# Patient Record
Sex: Female | Born: 1960 | Race: Black or African American | Hispanic: No | Marital: Single | Smoking: Never smoker
Health system: Southern US, Community
[De-identification: ages and names within clinical notes are randomized; demographics above are authoritative.]

## PROBLEM LIST (undated history)

## (undated) DIAGNOSIS — T7840XA Allergy, unspecified, initial encounter: Secondary | ICD-10-CM

## (undated) HISTORY — PX: CYSTECTOMY: SUR359

## (undated) HISTORY — DX: Allergy, unspecified, initial encounter: T78.40XA

## (undated) HISTORY — PX: ABDOMINAL HYSTERECTOMY: SHX81

---

## 2004-12-27 ENCOUNTER — Ambulatory Visit: Payer: Self-pay | Admitting: Obstetrics and Gynecology

## 2006-01-02 ENCOUNTER — Ambulatory Visit: Payer: Self-pay | Admitting: Obstetrics and Gynecology

## 2006-01-20 ENCOUNTER — Ambulatory Visit: Payer: Self-pay | Admitting: Obstetrics and Gynecology

## 2007-01-06 ENCOUNTER — Ambulatory Visit: Payer: Self-pay | Admitting: Obstetrics and Gynecology

## 2008-01-19 ENCOUNTER — Ambulatory Visit: Payer: Self-pay | Admitting: Obstetrics and Gynecology

## 2009-01-19 ENCOUNTER — Ambulatory Visit: Payer: Self-pay | Admitting: Obstetrics and Gynecology

## 2010-01-23 ENCOUNTER — Ambulatory Visit: Payer: Self-pay | Admitting: Obstetrics and Gynecology

## 2011-01-28 ENCOUNTER — Ambulatory Visit: Payer: Self-pay | Admitting: Obstetrics and Gynecology

## 2012-01-29 ENCOUNTER — Ambulatory Visit: Payer: Self-pay | Admitting: Obstetrics and Gynecology

## 2012-12-29 LAB — HM PAP SMEAR

## 2012-12-29 LAB — HM MAMMOGRAPHY

## 2013-02-08 ENCOUNTER — Ambulatory Visit: Payer: Self-pay | Admitting: Obstetrics and Gynecology

## 2013-05-07 ENCOUNTER — Ambulatory Visit: Payer: Self-pay | Admitting: Unknown Physician Specialty

## 2013-05-07 LAB — HM COLONOSCOPY

## 2014-02-10 ENCOUNTER — Ambulatory Visit: Payer: Self-pay | Admitting: Obstetrics and Gynecology

## 2014-08-10 LAB — LIPID PANEL
CHOLESTEROL: 160 mg/dL (ref 0–200)
HDL: 44 mg/dL (ref 35–70)
LDL Cholesterol: 102 mg/dL
Triglycerides: 68 mg/dL (ref 40–160)

## 2014-08-10 LAB — BASIC METABOLIC PANEL
BUN: 9 mg/dL (ref 4–21)
Creatinine: 1 mg/dL (ref 0.5–1.1)
Glucose: 99 mg/dL
Potassium: 4.5 mmol/L (ref 3.4–5.3)
SODIUM: 140 mmol/L (ref 137–147)

## 2015-02-23 ENCOUNTER — Other Ambulatory Visit: Payer: Self-pay | Admitting: Obstetrics and Gynecology

## 2015-02-23 DIAGNOSIS — Z1231 Encounter for screening mammogram for malignant neoplasm of breast: Secondary | ICD-10-CM

## 2015-03-07 ENCOUNTER — Ambulatory Visit
Admission: RE | Admit: 2015-03-07 | Discharge: 2015-03-07 | Disposition: A | Discharge status: Home / Self Care | Payer: BLUE CROSS/BLUE SHIELD | Source: Ambulatory Visit | Attending: Obstetrics and Gynecology | Admitting: Obstetrics and Gynecology

## 2015-03-07 DIAGNOSIS — Z1231 Encounter for screening mammogram for malignant neoplasm of breast: Secondary | ICD-10-CM

## 2015-03-31 ENCOUNTER — Ambulatory Visit (INDEPENDENT_AMBULATORY_CARE_PROVIDER_SITE_OTHER): Payer: BLUE CROSS/BLUE SHIELD | Admitting: Family Medicine

## 2015-03-31 ENCOUNTER — Encounter: Payer: Self-pay | Admitting: Emergency Medicine

## 2015-03-31 ENCOUNTER — Encounter: Payer: Self-pay | Admitting: Family Medicine

## 2015-03-31 VITALS — BP 110/74 | HR 64 | Temp 97.9°F | Resp 16 | Wt 246.0 lb

## 2015-03-31 DIAGNOSIS — Z789 Other specified health status: Secondary | ICD-10-CM | POA: Insufficient documentation

## 2015-03-31 DIAGNOSIS — S39012A Strain of muscle, fascia and tendon of lower back, initial encounter: Secondary | ICD-10-CM | POA: Diagnosis not present

## 2015-03-31 DIAGNOSIS — L819 Disorder of pigmentation, unspecified: Secondary | ICD-10-CM | POA: Insufficient documentation

## 2015-03-31 MED ORDER — CYCLOBENZAPRINE HCL 5 MG PO TABS: 5.0000 mg | ORAL_TABLET | Freq: Three times a day (TID) | ORAL | Status: DC | PRN

## 2015-03-31 NOTE — Patient Instructions (Signed)
Continue ibuprofen 400 mg. 3 x day with food. You may add Tylenol up to 3000 mg/day. Cold compresses for 20 minutes several x today-thereafter use heat.

## 2015-03-31 NOTE — Progress Notes (Signed)
Subjective:     Patient ID: Jennifer Sawyer, female   DOB: 11/10/1960, 54 y.o.   MRN: [id number]  HPI  Chief Complaint  Patient presents with  . Hip Pain  States she was working in her yard prior to the onset of her sx two days ago. Localizes to her right low back. No radiation of pain. Has been taking up to 400 mg. ibuprofen twice daily. No hx of back surgery   Review of Systems  Genitourinary: Negative for dysuria.       Objective:   Physical Exam  Constitutional: She appears well-developed and well-nourished. She appears distressed (moderate pain when changing positions).  Muscle strength in lower extremities 5/5. Left SLR to 90 degrees without radiation of back pain. Right SLR to 45 degrees before increased back pain without radiation. Tender over her right SI area.    Assessment:    1. Low back strain, initial encounter - cyclobenzaprine (FLEXERIL) 5 MG tablet; Take 1 tablet (5 mg total) by mouth 3 (three) times daily as needed for muscle spasms.  Dispense: 21 tablet; Refill: 0    Plan:    Continue nsaid's. Discussed application of cold and warm compresses. Return if not improving or radicular sx.

## 2015-08-16 ENCOUNTER — Encounter: Payer: Self-pay | Admitting: Family Medicine

## 2015-08-16 ENCOUNTER — Ambulatory Visit (INDEPENDENT_AMBULATORY_CARE_PROVIDER_SITE_OTHER): Payer: BLUE CROSS/BLUE SHIELD | Admitting: Family Medicine

## 2015-08-16 VITALS — BP 120/82 | HR 85 | Temp 98.7°F | Resp 16 | Ht 65.0 in | Wt 250.0 lb

## 2015-08-16 DIAGNOSIS — Z0000 Encounter for general adult medical examination without abnormal findings: Secondary | ICD-10-CM

## 2015-08-16 DIAGNOSIS — E669 Obesity, unspecified: Secondary | ICD-10-CM | POA: Insufficient documentation

## 2015-08-16 NOTE — Patient Instructions (Addendum)
We will call you with the lab results. Start exercising 30 minutes daily. Consider Weight Watcher's program. Continue with low fat food choices.

## 2015-08-16 NOTE — Progress Notes (Signed)
Subjective:     Patient ID: Jennifer Sawyer, female   DOB: 01/23/1961, 54 y.o.   MRN: [id number]  HPI  Chief Complaint  Patient presents with  . Annual Exam    Patient comes in office today fo her annual physical, patient states that she has no questions or concerns to address today. Patient states that she recieved flu vaccine from work in 07/2015  States she has not been motivated to exercise. Reports low fat food choices most of the time.   Review of Systems General: Feeling well HEENT: regular dental visits and eye exams Cardiovascular: no chest pain, shortness of breath, or palpitations GI: no heartburn, no change in bowel habits. States increase in dietary fiber has resolved straining. GU:  no change in bladder habits. Up to date with Gyn in the Spring Psychiatric: not depressed Musculoskeletal: occasional left knee pain when going up stairs.    Objective:   Physical Exam  Constitutional: She appears well-developed and well-nourished. No distress.  Eyes: Pupils equal (exam suboptimal due to presence of colored contacts) EOMI Neck: no thyromegaly, tenderness or nodules; no cervical lymphadenopathy, no carotid bruits. ENT: TM's intact without inflammation; No tonsillar enlargement or exudate, Lungs: Clear Heart : RRR without murmur or gallop Abd: bowel sounds present, soft, non-tender, no organomegaly Extremities: no edema .     Assessment:    1. Annual physical exam - Comprehensive metabolic panel - Lipid panel  2. Obesity    Plan:    Further f/u pending lab work. Encouraged regular exercise/Weight Watcher's.

## 2015-08-17 ENCOUNTER — Telehealth: Payer: Self-pay

## 2015-08-17 LAB — COMPREHENSIVE METABOLIC PANEL
A/G RATIO: 1 — AB (ref 1.1–2.5)
ALT: 18 IU/L (ref 0–32)
AST: 17 IU/L (ref 0–40)
Albumin: 4.1 g/dL (ref 3.5–5.5)
Alkaline Phosphatase: 62 IU/L (ref 39–117)
BUN/Creatinine Ratio: 13 (ref 9–23)
BUN: 12 mg/dL (ref 6–24)
Bilirubin Total: 0.6 mg/dL (ref 0.0–1.2)
CO2: 25 mmol/L (ref 18–29)
Calcium: 9.6 mg/dL (ref 8.7–10.2)
Chloride: 102 mmol/L (ref 97–106)
Creatinine, Ser: 0.94 mg/dL (ref 0.57–1.00)
GFR, EST AFRICAN AMERICAN: 80 mL/min/{1.73_m2} (ref >59)
GFR, EST NON AFRICAN AMERICAN: 69 mL/min/{1.73_m2} (ref >59)
Globulin, Total: 4.3 g/dL (ref 1.5–4.5)
Glucose: 105 mg/dL — ABNORMAL HIGH (ref 65–99)
POTASSIUM: 4.6 mmol/L (ref 3.5–5.2)
Sodium: 141 mmol/L (ref 136–144)
TOTAL PROTEIN: 8.4 g/dL (ref 6.0–8.5)

## 2015-08-17 LAB — LIPID PANEL
Chol/HDL Ratio: 3.9 ratio units (ref 0.0–4.4)
Cholesterol, Total: 179 mg/dL (ref 100–199)
HDL: 46 mg/dL (ref >39)
LDL Calculated: 116 mg/dL — ABNORMAL HIGH (ref 0–99)
Triglycerides: 87 mg/dL (ref 0–149)
VLDL CHOLESTEROL CAL: 17 mg/dL (ref 5–40)

## 2015-08-17 LAB — COMMENT

## 2015-08-17 NOTE — Telephone Encounter (Signed)
[id number] 

## 2015-08-17 NOTE — Telephone Encounter (Signed)
-----   Message from Anola Gurneyobert Chauvin, [place] sent at 08/17/2015  7:56 AM EST ----- Cholesterol is mildly elevated but your sugar has crept up to the pre-diabetes range. Jennifer HahnKat will provide you with a  Phone number about a one time class at [facility] which will give you information about dietary choices and exercises. Hopefully this will motivate you and prevent further development into diabetes.

## 2015-08-18 NOTE — Telephone Encounter (Signed)
Patient has been advised of lab report, I notified her I will be mailing her handout in regards to pre-diabetes classes offered thru [facility]. KW

## 2015-10-03 ENCOUNTER — Telehealth: Payer: Self-pay | Admitting: Family Medicine

## 2015-10-03 NOTE — Telephone Encounter (Signed)
[id number] 

## 2015-10-03 NOTE — Telephone Encounter (Signed)
Pt would like Kat to return her call about documention for a referral, pt states she has talked with Georgiann HahnKat about this last week. Thanks CC

## 2015-10-04 ENCOUNTER — Other Ambulatory Visit: Payer: Self-pay | Admitting: Family Medicine

## 2015-10-04 DIAGNOSIS — E669 Obesity, unspecified: Secondary | ICD-10-CM

## 2015-10-04 NOTE — Telephone Encounter (Signed)
Referral in progress. 

## 2015-10-04 NOTE — Telephone Encounter (Signed)
Pt called back. She said please call her at [phone].  Thanks Barth Kirkseri

## 2015-10-04 NOTE — Telephone Encounter (Signed)
Spoke with patient on the phone she states that back in November you had recommenced for there to speak to a dietitian at [facility]. She states that she found out her insurance will cover the visits and would like for you to generate referral for her to be seen. She states that she spoke with a Cameron Proudina Brown from [facility] and she said there is a form physician normally  Lenox AhrFill out for evaluation, she states that Mrs. Browns fax number is [phone].KW

## 2015-10-24 ENCOUNTER — Encounter: Payer: BLUE CROSS/BLUE SHIELD | Attending: Family Medicine | Admitting: Dietician

## 2015-10-24 VITALS — Ht 65.0 in | Wt 255.9 lb

## 2015-10-24 DIAGNOSIS — E669 Obesity, unspecified: Secondary | ICD-10-CM

## 2015-10-24 NOTE — Patient Instructions (Signed)
Increase walking to 2 miles at lunch break. Balance meals with protein, 2-3 servings of carbohydrate and non-starchy vegetables. To participate in an exercise class in addition to walking at lunch. Measure out some of the starchy foods to check portions. Balance a higher fat food with low fat vegetables and fruits.

## 2015-10-24 NOTE — Progress Notes (Signed)
Medical Nutrition Therapy: Visit start time: 8:50 end time: 9:45 Assessment:  Diagnosis: obesity Psychosocial issues/ stress concerns: Patient rates her stress as "moderate" and indicates "ok" as to how she is dealing with her stress. Preferred learning method:  . Hands-on Current weight: 255.9 lbs (clothes/shoes)  Height: 65 in Medications, supplements: fish oil, multivitamin, garlic tablet, cinnamon tablet; no medications  Progress and evaluation:  Patient in for initial nutrition assessment. She reports she has had success in the past with weight loss efforts, losing 60 lbs approximately 4 years ago through [employer]. She participates each year in "Be Healthy Now [place]" and is looking forward to doing so this year. She states she knows what she needs to do but is having trouble feeling motivated. Rates her motivation to make diet and exercise changes as a "5" on a 0-10 scale. She identifies an inconsistent meal pattern as a problem area and states "I love bread" which she states is her "weakness" related diet. She likes fruits, a variety of vegetables, baked meats,etc but states she needs a push for better controlling portions. Her main beverage is water and she drinks 6-7 cups per day. Rarely drinks beverages with sugar.  Physical activity: walks at work during lunch break for 30 minutes  Dietary Intake:  Usual eating pattern includes 3 meals and 2 snacks per day. Dining out frequency: 3 meals per week.  Breakfast: 5:45am- cereal/banana or cheese toast or egg/2 strips bacon, milk or orange juice Snack: grapes, celery; cheese stick Lunch: 1:30pm- salad, chicken nuggets or [employer] 6" veggie sandwich, chips, water Snack: apple Supper: 7:00- 7:30pm baked chicken, white rice or steamed vegetables, water Snack: usually no snack Beverages: water, Crystal Lite occasional tea when dining "out",  Nutrition Care Education: Weight control: Instructed patient on a meal plan based on 1600  calories including identifying carbohydrate foods, and balancing with protein and non-starchy vegetables. Stressed strategies verses strict dieting explaining that meal plan is a guide helping her to be more mindful of what and how much she is eating. Discussed portion control. Also, encouraged to focus on foods needed to meet nutrient needs verses only on foods to be limited.  Nutritional Diagnosis:  [place]-3.3 Overweight/obesity As related to inconsistent meal pattern, lack of portion control.  As evidenced by diet history..  Intervention:  Increase walking to 2 miles at lunch break. Balance meals with protein, 2-3 servings of carbohydrate and non-starchy vegetables. To participate in an exercise class in addition to walking at lunch. Measure out some of the starchy foods to check portions. Balance a higher fat food with low fat vegetables and fruits.   Education Materials given:  .  Marland Kitchen Food lists/ Planning A Balanced Meal . Sample pattern/ menus . Goals/ instructions Learner/ who was taught:  . Patient  Level of understanding: . Partial understanding; needs review/ practice Learning barriers: . None  Willingness to learn/ readiness for change: . Eager, change in progress Monitoring and Evaluation:  Dietary intake, exercise,  and body weight      follow up: Feb. 21, 2017 at 9:00am

## 2015-11-21 ENCOUNTER — Ambulatory Visit: Payer: BLUE CROSS/BLUE SHIELD | Admitting: Dietician

## 2015-12-19 ENCOUNTER — Encounter: Payer: Self-pay | Admitting: Dietician

## 2016-02-15 ENCOUNTER — Other Ambulatory Visit: Payer: Self-pay | Admitting: Obstetrics and Gynecology

## 2016-02-15 DIAGNOSIS — Z1231 Encounter for screening mammogram for malignant neoplasm of breast: Secondary | ICD-10-CM

## 2016-03-07 ENCOUNTER — Other Ambulatory Visit: Payer: Self-pay | Admitting: Obstetrics and Gynecology

## 2016-03-07 ENCOUNTER — Ambulatory Visit
Admission: RE | Admit: 2016-03-07 | Discharge: 2016-03-07 | Disposition: A | Discharge status: Home / Self Care | Payer: BLUE CROSS/BLUE SHIELD | Source: Ambulatory Visit | Attending: Obstetrics and Gynecology | Admitting: Obstetrics and Gynecology

## 2016-03-07 DIAGNOSIS — Z1231 Encounter for screening mammogram for malignant neoplasm of breast: Secondary | ICD-10-CM

## 2016-08-20 ENCOUNTER — Encounter: Payer: Self-pay | Admitting: Family Medicine

## 2016-08-20 ENCOUNTER — Ambulatory Visit (INDEPENDENT_AMBULATORY_CARE_PROVIDER_SITE_OTHER): Payer: BLUE CROSS/BLUE SHIELD | Admitting: Family Medicine

## 2016-08-20 VITALS — BP 120/68 | HR 84 | Temp 98.3°F | Resp 16 | Ht 66.0 in | Wt 260.0 lb

## 2016-08-20 DIAGNOSIS — Z0000 Encounter for general adult medical examination without abnormal findings: Secondary | ICD-10-CM | POA: Diagnosis not present

## 2016-08-20 DIAGNOSIS — E6609 Other obesity due to excess calories: Secondary | ICD-10-CM | POA: Diagnosis not present

## 2016-08-20 DIAGNOSIS — IMO0001 Reserved for inherently not codable concepts without codable children: Secondary | ICD-10-CM

## 2016-08-20 DIAGNOSIS — Z6841 Body Mass Index (BMI) 40.0 and over, adult: Secondary | ICD-10-CM | POA: Diagnosis not present

## 2016-08-20 DIAGNOSIS — W57XXXA Bitten or stung by nonvenomous insect and other nonvenomous arthropods, initial encounter: Secondary | ICD-10-CM

## 2016-08-20 MED ORDER — FLUOCINONIDE 0.05 % EX CREA: TOPICAL_CREAM | CUTANEOUS | 0 refills | Status: DC

## 2016-08-20 NOTE — Patient Instructions (Signed)
We will call you with the lab results. Consider walking at least 30 minutes daily.

## 2016-08-20 NOTE — Progress Notes (Signed)
Subjective:     Patient ID: Jennifer Sawyer, female   DOB: 10/17/1960, 55 y.o.   MRN: [id number]  HPI  Chief Complaint  Patient presents with  . Annual Exam    Last CPE- 08/16/2015. Saw GYN for yearly gynecological exam on 02/29/2016. Last colonoscopy- 05/07/2013 with Dr. Mechele CollinElliott; sigmoid diverticulosis and internal hemorrhoids. Pt feels well, other than a bug bite/ rash on right side of neck x 2 weeks. Pt has tried Benadryl and hydrocortisone cream for this, with relief.  States rash has improved but still has mild itching. Phone image at onset two weeks ago appears vesicular. Continues to live with her 55 year old niece.   Review of Systems General: Feeling well. Admits to not paying attention to her weight issue due to focusing on work. States appetite and food choices and not an issue. HEENT: regular dental visits and eye exams Cardiovascular: no chest pain, shortness of breath, or palpitations GI: no heartburn, no change in bowel habits or blood in the stool GU:  no change in bladder habits  Psychiatric: not depressed; but admits to stress due to a new boss and having to change their way of doing things at work. Musculoskeletal: no joint pain    Objective:   Physical Exam  Constitutional: She appears well-developed and well-nourished. No distress.  Eyes:  EOMI, pupils not well seen due to colored contacts Neck: no thyromegaly, tenderness or nodules, no carotid bruits or cervical adenopathy ENT: TM's intact without inflammation; No tonsillar enlargement or exudate, Lungs: Clear Heart : RRR without murmur or gallop Abd: bowel sounds present, soft, non-tender, no organomegaly Extremities: no edema Skin: right neck with several residual 3-4 mm papules     Assessment:    1. Annual physical exam - Comprehensive metabolic panel - Lipid panel  2. Class 3 obesity due to excess calories without serious comorbidity with body mass index (BMI) of 40.0 to 44.9 in adult (HCC) - T4,  free - TSH  3. Insect bite, initial encounter - fluocinonide cream (LIDEX) 0.05 %; To insect bites  Dispense: 30 g; Refill: 0    Plan:    Discussed increasing walking program to daily. Further f/u pending lab work.

## 2016-08-21 ENCOUNTER — Telehealth: Payer: Self-pay

## 2016-08-21 LAB — COMPREHENSIVE METABOLIC PANEL
A/G RATIO: 0.9 — AB (ref 1.2–2.2)
ALK PHOS: 61 IU/L (ref 39–117)
ALT: 12 IU/L (ref 0–32)
AST: 14 IU/L (ref 0–40)
Albumin: 4 g/dL (ref 3.5–5.5)
BILIRUBIN TOTAL: 0.5 mg/dL (ref 0.0–1.2)
BUN/Creatinine Ratio: 15 (ref 9–23)
BUN: 13 mg/dL (ref 6–24)
CHLORIDE: 101 mmol/L (ref 96–106)
CO2: 26 mmol/L (ref 18–29)
Calcium: 9.3 mg/dL (ref 8.7–10.2)
Creatinine, Ser: 0.84 mg/dL (ref 0.57–1.00)
GFR calc non Af Amer: 79 mL/min/{1.73_m2} (ref >59)
GFR, EST AFRICAN AMERICAN: 91 mL/min/{1.73_m2} (ref >59)
Globulin, Total: 4.3 g/dL (ref 1.5–4.5)
Glucose: 98 mg/dL (ref 65–99)
POTASSIUM: 4.6 mmol/L (ref 3.5–5.2)
Sodium: 140 mmol/L (ref 134–144)
TOTAL PROTEIN: 8.3 g/dL (ref 6.0–8.5)

## 2016-08-21 LAB — LIPID PANEL
Chol/HDL Ratio: 4.4 ratio units (ref 0.0–4.4)
Cholesterol, Total: 175 mg/dL (ref 100–199)
HDL: 40 mg/dL (ref >39)
LDL Calculated: 120 mg/dL — ABNORMAL HIGH (ref 0–99)
Triglycerides: 75 mg/dL (ref 0–149)
VLDL Cholesterol Cal: 15 mg/dL (ref 5–40)

## 2016-08-21 LAB — TSH: TSH: 1.67 u[IU]/mL (ref 0.450–4.500)

## 2016-08-21 LAB — T4, FREE: FREE T4: 1.09 ng/dL (ref 0.82–1.77)

## 2016-08-21 NOTE — Telephone Encounter (Signed)
-----   Message from Anola Gurneyobert Chauvin, [place] sent at 08/21/2016  7:26 AM EST ----- Labs look good. Sugar and thyroid are ok. Cholesterol is mildly elevated but your calculated 10 year risk for developing cardiovascular disease is low at 2.9%. We usually recommend a cholesterol drug lowering at 7.5%.

## 2016-08-21 NOTE — Telephone Encounter (Signed)
Patient was advised. KW 

## 2017-03-07 ENCOUNTER — Other Ambulatory Visit: Payer: Self-pay | Admitting: Obstetrics and Gynecology

## 2017-03-07 DIAGNOSIS — Z1231 Encounter for screening mammogram for malignant neoplasm of breast: Secondary | ICD-10-CM

## 2017-03-18 ENCOUNTER — Ambulatory Visit
Admission: RE | Admit: 2017-03-18 | Discharge: 2017-03-18 | Disposition: A | Discharge status: Home / Self Care | Payer: BLUE CROSS/BLUE SHIELD | Source: Ambulatory Visit | Attending: Obstetrics and Gynecology | Admitting: Obstetrics and Gynecology

## 2017-03-18 DIAGNOSIS — Z1231 Encounter for screening mammogram for malignant neoplasm of breast: Secondary | ICD-10-CM

## 2017-08-22 ENCOUNTER — Encounter: Payer: Self-pay | Admitting: Family Medicine

## 2017-08-22 ENCOUNTER — Ambulatory Visit (INDEPENDENT_AMBULATORY_CARE_PROVIDER_SITE_OTHER): Payer: BLUE CROSS/BLUE SHIELD | Admitting: Family Medicine

## 2017-08-22 VITALS — BP 126/70 | HR 82 | Temp 98.0°F | Resp 16 | Ht 66.0 in | Wt 250.0 lb

## 2017-08-22 DIAGNOSIS — Z0000 Encounter for general adult medical examination without abnormal findings: Secondary | ICD-10-CM

## 2017-08-22 DIAGNOSIS — Z131 Encounter for screening for diabetes mellitus: Secondary | ICD-10-CM | POA: Diagnosis not present

## 2017-08-22 DIAGNOSIS — J018 Other acute sinusitis: Secondary | ICD-10-CM

## 2017-08-22 DIAGNOSIS — Z6841 Body Mass Index (BMI) 40.0 and over, adult: Secondary | ICD-10-CM | POA: Diagnosis not present

## 2017-08-22 DIAGNOSIS — Z13220 Encounter for screening for lipoid disorders: Secondary | ICD-10-CM

## 2017-08-22 LAB — LIPID PANEL
CHOL/HDL RATIO: 3.6 (calc) (ref <5.0)
CHOLESTEROL: 174 mg/dL (ref <200)
HDL: 48 mg/dL — ABNORMAL LOW (ref >50)
LDL CHOLESTEROL (CALC): 110 mg/dL — AB
Non-HDL Cholesterol (Calc): 126 mg/dL (calc) (ref <130)
TRIGLYCERIDES: 75 mg/dL (ref <150)

## 2017-08-22 LAB — COMPLETE METABOLIC PANEL WITH GFR
AG RATIO: 1 (calc) (ref 1.0–2.5)
ALBUMIN MSPROF: 3.9 g/dL (ref 3.6–5.1)
ALT: 15 U/L (ref 6–29)
AST: 15 U/L (ref 10–35)
Alkaline phosphatase (APISO): 58 U/L (ref 33–130)
BILIRUBIN TOTAL: 0.5 mg/dL (ref 0.2–1.2)
BUN: 14 mg/dL (ref 7–25)
CHLORIDE: 105 mmol/L (ref 98–110)
CO2: 28 mmol/L (ref 20–32)
CREATININE: 0.9 mg/dL (ref 0.50–1.05)
Calcium: 9.1 mg/dL (ref 8.6–10.4)
GFR, EST AFRICAN AMERICAN: 83 mL/min/{1.73_m2} (ref >=60)
GFR, Est Non African American: 72 mL/min/{1.73_m2} (ref >=60)
GLUCOSE: 97 mg/dL (ref 65–99)
Globulin: 4.1 g/dL (calc) — ABNORMAL HIGH (ref 1.9–3.7)
Potassium: 4.2 mmol/L (ref 3.5–5.3)
Sodium: 138 mmol/L (ref 135–146)
Total Protein: 8 g/dL (ref 6.1–8.1)

## 2017-08-22 MED ORDER — AMOXICILLIN-POT CLAVULANATE 875-125 MG PO TABS: 1.0000 | ORAL_TABLET | Freq: Two times a day (BID) | ORAL | 0 refills | Status: DC

## 2017-08-22 NOTE — Patient Instructions (Addendum)
We will call you with the lab results. Keep up your weight loss program! Use products like Mucinex D for your sinus congestioin

## 2017-08-22 NOTE — Progress Notes (Signed)
Subjective:     Patient ID: Jennifer Sawyer, female   DOB: 12/09/1960, 56 y.o.   MRN: [id number] Chief Complaint  Patient presents with  . Annual Exam   HPI Continues to work at [employer] and lives with her 56 year old niece. Works out 5-6 days/ weeks with walking and calisthenic type exercise. 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  Review of Systems General: Feeling well. Received flu shot at work in October. HEENT: regular dental visits and eye exams (contacts). Reports allergy flare a couple of weeks ago, now with purulent sinus drainage. Cardiovascular: no chest pain, shortness of breath, or palpitations GI: no heartburn, no change in bowel habits or blood in the stool GU:  no change in bladder habits. Has had gyn exam and mammogram this year Psychiatric: not depressed Musculoskeletal: no joint pain    Objective:   Physical Exam  Constitutional: She appears well-developed and well-nourished. No distress.  Eyes: Pupils equal (contacts diminish visualization of reactivity); EOMI Neck: no thyromegaly, tenderness or nodules,  ENT: TM's intact without inflammation; No tonsillar enlargement or exudate, Lungs: Clear Heart : RRR without murmur or gallop Abd: bowel sounds present, soft, non-tender, no organomegaly Extremities: no edema     Assessment:    1. Annual physical exam  2. Screening for cholesterol level - Lipid panel  3. Screening for diabetes mellitus - COMPLETE METABOLIC PANEL WITH GFR  4. Class 3 severe obesity due to excess calories without serious comorbidity with body mass index (BMI) of 40.0 to 44.9 in adult (HCC)  5. Other subacute sinusitis: Augmentin 875 mg. q 12 hours #20    Plan:    Further f/u pending lab work. May add Mucinex  D for her sinuses.

## 2017-08-25 ENCOUNTER — Telehealth: Payer: Self-pay

## 2017-08-25 ENCOUNTER — Other Ambulatory Visit: Payer: Self-pay | Admitting: Family Medicine

## 2017-08-25 MED ORDER — PSEUDOEPHEDRINE-GUAIFENESIN ER 60-600 MG PO TB12: 1.0000 | ORAL_TABLET | Freq: Two times a day (BID) | ORAL | 0 refills | Status: DC

## 2017-08-25 NOTE — Telephone Encounter (Signed)
Advised  ED 

## 2017-08-25 NOTE — Telephone Encounter (Signed)
Mucinex D sent in to [facility].

## 2017-08-25 NOTE — Telephone Encounter (Signed)
Pt advised of lab results.  She would also like a prescription for [facility] [place].  She says she can use her flexible spending if she has a prescription.   Thanks,   -Vernona RiegerLaura

## 2017-09-04 ENCOUNTER — Telehealth: Payer: Self-pay | Admitting: Family Medicine

## 2017-09-04 ENCOUNTER — Other Ambulatory Visit: Payer: Self-pay | Admitting: Family Medicine

## 2017-09-04 MED ORDER — FLUCONAZOLE 150 MG PO TABS: 150.0000 mg | ORAL_TABLET | Freq: Once | ORAL | 0 refills | Status: AC

## 2017-09-04 NOTE — Telephone Encounter (Signed)
done

## 2017-09-04 NOTE — Telephone Encounter (Signed)
Pt stated that taking amoxicillin-clavulanate (AUGMENTIN) 875-125 MG tablet has caused her to get a yeast infection & pt is requesting an Rx [facility]. Please advise. Thanks TNP

## 2017-09-04 NOTE — Telephone Encounter (Signed)
Please review. KW 

## 2017-11-27 ENCOUNTER — Ambulatory Visit: Payer: BLUE CROSS/BLUE SHIELD | Admitting: Family Medicine

## 2017-11-27 ENCOUNTER — Encounter: Payer: Self-pay | Admitting: Family Medicine

## 2017-11-27 VITALS — BP 122/74 | HR 74 | Temp 98.3°F | Resp 16 | Wt 246.0 lb

## 2017-11-27 DIAGNOSIS — J018 Other acute sinusitis: Secondary | ICD-10-CM

## 2017-11-27 MED ORDER — AMOXICILLIN-POT CLAVULANATE 875-125 MG PO TABS: 1.0000 | ORAL_TABLET | Freq: Two times a day (BID) | ORAL | 0 refills | Status: DC

## 2017-11-27 NOTE — Patient Instructions (Signed)
Resume Mucinex D for congestion and Delsym for cough. Let me know if dizziness does not improve.

## 2017-11-27 NOTE — Progress Notes (Signed)
Subjective:     Patient ID: Jennifer Sawyer, female   DOB: 10/03/1960, 57 y.o.   MRN: [id number] Chief Complaint  Patient presents with  . Cough    Patient has had cough and congestion X 1 week. She reports that she has been taking Alker seltzer plus without relief. She denies any fever. She does have history of allergies. She also mentions that she has had dizzy spells off and on. She also describes her cough as a dry, hacking cough.    HPI States she started two weeks ago with runny nose, watery eyes, and sneezing. Now has persistent purulent sinus drainage with PND and accompanying cough.  Review of Systems     Objective:   Physical Exam  Constitutional: She appears well-developed and well-nourished. No distress.  Ears: T.M's intact without inflammation Sinuses: non-tender Throat: no tonsillar enlargement or exudate Neck: no cervical adenopathy Lungs: clear     Assessment:    1. Other subacute sinusitis - amoxicillin-clavulanate (AUGMENTIN) 875-125 MG tablet; Take 1 tablet by mouth 2 (two) times daily.  Dispense: 20 tablet; Refill: 0    Plan:    Resume Mucinex D.Will call if dizziness does not improve.

## 2018-01-28 ENCOUNTER — Ambulatory Visit: Payer: BLUE CROSS/BLUE SHIELD | Admitting: Family Medicine

## 2018-01-28 ENCOUNTER — Encounter: Payer: Self-pay | Admitting: Family Medicine

## 2018-01-28 VITALS — BP 110/70 | HR 84 | Temp 98.6°F | Resp 16 | Wt 245.6 lb

## 2018-01-28 DIAGNOSIS — S39012A Strain of muscle, fascia and tendon of lower back, initial encounter: Secondary | ICD-10-CM

## 2018-01-28 MED ORDER — CYCLOBENZAPRINE HCL 5 MG PO TABS: 5.0000 mg | ORAL_TABLET | Freq: Three times a day (TID) | ORAL | 0 refills | Status: DC | PRN

## 2018-01-28 NOTE — Progress Notes (Signed)
  Subjective:     Patient ID: Jennifer Sawyer, female   DOB: 1961/01/04, 57 y.o.   MRN: [id number] Chief Complaint  Patient presents with  . Muscle Pain    Patient comes in office today with complaints of pain in her right hip and knee for the past 2 days or more. Patient reports difficulty with bending, squatting and standing up from sitting position. Patient denies any strenuous exercise or activity that would trigger pain. Paitent has been using biofreeze for pain relief.    HPI States she has had similar pain in the past which got better with Aleve. Denies radicular sx. States her right knee started hurting after her back.  Review of Systems     Objective:   Physical Exam  Constitutional: She appears well-developed and well-nourished. She appears distressed (moderate pain when changing positions).  Musculoskeletal:  Muscle strength in lower extremities 5/5. SLR's to 90 degrees without radiation of back pain. Localizes and is mildly tender in her right SI area. Right knee ligaments stable. No specific areas of tenderness appreciated.       Assessment:    1. Strain of lumbar region, initial encounter - cyclobenzaprine (FLEXERIL) 5 MG tablet; Take 1 tablet (5 mg total) by mouth 3 (three) times daily as needed for muscle spasms.  Dispense: 21 tablet; Refill: 0    Plan:    Discussed starting Aleve and warm compresses. Further f/u if not improving over the next two weeks. Discussed red flag sx with her.

## 2018-01-28 NOTE — Patient Instructions (Signed)
Start two Aleve twice daily with food. Apply warm compresses for 20 minutes several x day.

## 2018-03-11 ENCOUNTER — Other Ambulatory Visit: Payer: Self-pay | Admitting: Obstetrics and Gynecology

## 2018-03-11 DIAGNOSIS — Z1231 Encounter for screening mammogram for malignant neoplasm of breast: Secondary | ICD-10-CM

## 2018-03-11 LAB — HM PAP SMEAR: HM Pap smear: NEGATIVE

## 2018-04-03 ENCOUNTER — Ambulatory Visit
Admission: RE | Admit: 2018-04-03 | Discharge: 2018-04-03 | Disposition: A | Discharge status: Home / Self Care | Payer: BLUE CROSS/BLUE SHIELD | Source: Ambulatory Visit | Attending: Obstetrics and Gynecology | Admitting: Obstetrics and Gynecology

## 2018-04-03 DIAGNOSIS — Z1231 Encounter for screening mammogram for malignant neoplasm of breast: Secondary | ICD-10-CM | POA: Diagnosis present

## 2018-08-25 ENCOUNTER — Ambulatory Visit (INDEPENDENT_AMBULATORY_CARE_PROVIDER_SITE_OTHER): Payer: BLUE CROSS/BLUE SHIELD | Admitting: Family Medicine

## 2018-08-25 ENCOUNTER — Encounter: Payer: Self-pay | Admitting: Family Medicine

## 2018-08-25 VITALS — BP 124/66 | HR 90 | Temp 98.4°F | Resp 16 | Ht 65.5 in | Wt 254.4 lb

## 2018-08-25 DIAGNOSIS — Z6841 Body Mass Index (BMI) 40.0 and over, adult: Secondary | ICD-10-CM | POA: Diagnosis not present

## 2018-08-25 DIAGNOSIS — Z0000 Encounter for general adult medical examination without abnormal findings: Secondary | ICD-10-CM | POA: Diagnosis not present

## 2018-08-25 NOTE — Patient Instructions (Signed)
We will call you with the lab results. Continue regular exercise daily. For cold symptoms try Mucinex D for congestion and Delsym for cough.

## 2018-08-25 NOTE — Progress Notes (Signed)
  Subjective:     Patient ID: Jennifer Sawyer, female   DOB: 07/01/1961, 57 y.o.   MRN: [id number] Chief Complaint  Patient presents with  . Annual Exam    Patient comes in office today for her annual physical she states that she feels fairly well today and has had cold like symptoms for the past few days but treating with otc medication.  Patient reports following a balanced diet and staying active walking up to 3x a week, on average patient sleeps 7hrs a night. Patient last reported pap smear was 12/2017, mammogram 04/03/18, tdap 08/01/2011 and flu vaccine 06/2018.    HPI Continues to work at [employer]. 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 Sees Dr. Feliberto GottronSchermerhorn for gyn.  Review of Systems General: Reports mild cold sx. HEENT: regular dental visits and eye exams (contact and glasses) Cardiovascular: no chest pain, shortness of breath, or palpitations GI: no heartburn, no change in bowel habits GU: nocturia x 0, no change in bladder habits  Psychiatric: not depressed Musculoskeletal: no joint pain    Objective:   Physical Exam  Constitutional: She appears well-developed and well-nourished. No distress.  Eyes: Pupils not well seen due to colored contacts. EOMI Neck: no thyromegaly, tenderness or nodules, no cervical adenopathy or carotid bruits. ENT: TM's intact without inflammation; No tonsillar enlargement or exudate, Lungs: Clear Heart : RRR without murmur or gallop Abd: bowel sounds present, soft, non-tender, no organomegaly Extremities: no edema      Assessment:    1. Annual physical exam - Lipid panel - Comprehensive metabolic panel  2. Class 3 severe obesity due to excess calories without serious comorbidity with body mass index (BMI) of 40.0 to 44.9 in adult [facility](HCC)    Plan:    Further f/u pending lab results. Continue with daily regular exercise.

## 2018-08-26 ENCOUNTER — Telehealth: Payer: Self-pay

## 2018-08-26 LAB — COMPREHENSIVE METABOLIC PANEL
ALBUMIN: 3.8 g/dL (ref 3.5–5.5)
ALT: 11 IU/L (ref 0–32)
AST: 16 IU/L (ref 0–40)
Albumin/Globulin Ratio: 1 — ABNORMAL LOW (ref 1.2–2.2)
Alkaline Phosphatase: 60 IU/L (ref 39–117)
BUN / CREAT RATIO: 11 (ref 9–23)
BUN: 10 mg/dL (ref 6–24)
Bilirubin Total: 0.4 mg/dL (ref 0.0–1.2)
CALCIUM: 9.2 mg/dL (ref 8.7–10.2)
CO2: 21 mmol/L (ref 20–29)
CREATININE: 0.94 mg/dL (ref 0.57–1.00)
Chloride: 102 mmol/L (ref 96–106)
GFR, EST AFRICAN AMERICAN: 78 mL/min/{1.73_m2} (ref >59)
GFR, EST NON AFRICAN AMERICAN: 68 mL/min/{1.73_m2} (ref >59)
GLOBULIN, TOTAL: 4 g/dL (ref 1.5–4.5)
Glucose: 87 mg/dL (ref 65–99)
Potassium: 4.2 mmol/L (ref 3.5–5.2)
SODIUM: 140 mmol/L (ref 134–144)
TOTAL PROTEIN: 7.8 g/dL (ref 6.0–8.5)

## 2018-08-26 LAB — LIPID PANEL
CHOLESTEROL TOTAL: 145 mg/dL (ref 100–199)
Chol/HDL Ratio: 3.6 ratio (ref 0.0–4.4)
HDL: 40 mg/dL (ref >39)
LDL Calculated: 91 mg/dL (ref 0–99)
Triglycerides: 69 mg/dL (ref 0–149)
VLDL Cholesterol Cal: 14 mg/dL (ref 5–40)

## 2018-08-26 NOTE — Telephone Encounter (Signed)
-----   Message from Anola Gurneyobert Chauvin, [place] sent at 08/26/2018  7:30 AM EST ----- Labs look good. Repeat annually.

## 2018-08-26 NOTE — Telephone Encounter (Signed)
Patient advised.KW 

## 2020-06-22 NOTE — Progress Notes (Signed)
Established patient visit   Patient: Jennifer Sawyer   DOB: [birth year]   59 y.o. Female  MRN: [id number] Visit Date: 06/23/2020  Today's healthcare provider: Trey Sailors, PA-C   Chief Complaint  Patient presents with  . Health Exam Form  I,Porsha C McClurkin,acting as a scribe for Trey Sailors, PA-C.,have documented all relevant documentation on the behalf of Trey Sailors, PA-C,as directed by  Trey Sailors, PA-C while in the presence of Trey Sailors, PA-C.   Subjective    HPI  Patient presents today for health exam form for work. She is not able to do CpE today due to lack of insurance.      Medications: Outpatient Medications Prior to Visit  Medication Sig  . cyclobenzaprine (FLEXERIL) 5 MG tablet Take 1 tablet (5 mg total) by mouth 3 (three) times daily as needed for muscle spasms. (Patient not taking: Reported on 08/25/2018)   No facility-administered medications prior to visit.    Review of Systems  Constitutional: Negative.   Respiratory: Negative.   Hematological: Negative.       Objective    BP (!) 141/83 (BP Location: Left Arm, Patient Position: Sitting, Cuff Size: Large)   Pulse 98   Temp 98 F (36.7 C) (Oral)   Ht 5\' 5"  (1.651 m)   Wt 248 lb (112.5 kg)   SpO2 97%   BMI 41.27 kg/m    Physical Exam Constitutional:      Appearance: Normal appearance. She is obese.  Cardiovascular:     Rate and Rhythm: Normal rate and regular rhythm.     Heart sounds: Normal heart sounds.  Pulmonary:     Effort: Pulmonary effort is normal.     Breath sounds: Normal breath sounds.  Skin:    General: Skin is warm and dry.  Neurological:     General: No focal deficit present.     Mental Status: She is alert and oriented to person, place, and time. Mental status is at baseline.  Psychiatric:        Mood and Affect: Mood normal.        Behavior: Behavior normal.       No results found for any visits on 06/23/20.  Assessment & Plan      1. Class 3 severe obesity due to excess calories without serious comorbidity with body mass index (BMI) of 40.0 to 44.9 in adult [facility])  Filled out health examination form and made recommendations to get Hep B shot and Tdap at [facility] as well as TB testing. She will schedule her CPE when insurance picks up and will schedule PAP with OBGYN.     No follow-ups on file.      [facility], PA-C, have reviewed all documentation for this visit. The documentation on 06/28/20 for the exam, diagnosis, procedures, and orders are all accurate and complete.  The entirety of the information documented in the History of Present Illness, Review of Systems and Physical Exam were personally obtained by me. Portions of this information were initially documented by 06/30/20, CMA and reviewed by me for thoroughness and accuracy.   I spent 20 minutes dedicated to the care of this patient on the date of this encounter to include pre-visit review of records, face-to-face time with the patient discussing health maintenance, and post visit ordering of testing.   Angelica Ran  [facility] [phone] (phone) [phone] (fax)  [facility]

## 2020-06-23 ENCOUNTER — Encounter: Payer: Self-pay | Admitting: Physician Assistant

## 2020-06-23 ENCOUNTER — Ambulatory Visit (INDEPENDENT_AMBULATORY_CARE_PROVIDER_SITE_OTHER): Payer: Self-pay | Admitting: Physician Assistant

## 2020-06-23 ENCOUNTER — Other Ambulatory Visit: Payer: Self-pay

## 2020-06-23 VITALS — BP 141/83 | HR 98 | Temp 98.0°F | Ht 65.0 in | Wt 248.0 lb

## 2020-06-23 DIAGNOSIS — Z6841 Body Mass Index (BMI) 40.0 and over, adult: Secondary | ICD-10-CM

## 2020-07-03 ENCOUNTER — Other Ambulatory Visit: Payer: Self-pay

## 2020-07-04 ENCOUNTER — Ambulatory Visit (LOCAL_COMMUNITY_HEALTH_CENTER): Payer: Self-pay

## 2020-07-04 ENCOUNTER — Other Ambulatory Visit: Payer: Self-pay

## 2020-07-04 DIAGNOSIS — Z23 Encounter for immunization: Secondary | ICD-10-CM

## 2020-07-04 DIAGNOSIS — Z111 Encounter for screening for respiratory tuberculosis: Secondary | ICD-10-CM

## 2020-07-04 NOTE — Progress Notes (Signed)
PPD placed today and has return appt for PPDR on 07/07/2020. Qualifies for Twinrix state supply. Twinrix and Flu vaccines given and tolerated well. Updated [facility] copy given and recommended schedule explained. Jerel Shepherd, RN

## 2020-07-05 ENCOUNTER — Telehealth: Payer: Self-pay

## 2020-07-05 NOTE — Telephone Encounter (Signed)
Copied from CRM [id number]. Topic: General - Other >> Jul 05, 2020 10:41 AM Gwenlyn Fudge wrote: Reason for CRM: Pt called stating that she received the Hep A&B, and TB skin test done on 07/04/20. She is requesting to now have the paperwork filled out and faxed to her employer. Please advise.

## 2020-07-06 NOTE — Telephone Encounter (Signed)
Waiting for PPD results on 07/07/20 and will fax paperwork then.

## 2020-07-07 ENCOUNTER — Ambulatory Visit (LOCAL_COMMUNITY_HEALTH_CENTER): Payer: Self-pay

## 2020-07-07 DIAGNOSIS — Z111 Encounter for screening for respiratory tuberculosis: Secondary | ICD-10-CM

## 2020-07-07 LAB — TB SKIN TEST
Induration: 0 mm
TB Skin Test: NEGATIVE

## 2020-07-07 NOTE — Telephone Encounter (Signed)
Faxed forms at 2:20pm. Called pt to advise.

## 2020-07-07 NOTE — Telephone Encounter (Signed)
Patient received her negative results, please fax paperwork to employer, fax # noted on the form. Patient would like form faxed if possible by 5pm and would like a follow up call [phone]

## 2020-07-10 ENCOUNTER — Telehealth: Payer: Self-pay

## 2020-07-10 NOTE — Telephone Encounter (Signed)
Copied from CRM [id number]. Topic: General - Other >> Jul 10, 2020  9:04 AM Gwenlyn Fudge wrote: Reason for CRM: Pt called stating that she received a message from [facility] for a Mr. Deniece Portela and that that was the wrong phone number. Please advise.

## 2020-08-03 ENCOUNTER — Ambulatory Visit (LOCAL_COMMUNITY_HEALTH_CENTER): Payer: Self-pay

## 2020-08-03 ENCOUNTER — Other Ambulatory Visit: Payer: Self-pay

## 2020-08-03 ENCOUNTER — Ambulatory Visit: Payer: Self-pay

## 2020-08-03 DIAGNOSIS — Z23 Encounter for immunization: Secondary | ICD-10-CM

## 2020-08-03 NOTE — Progress Notes (Signed)
Twinrix given; tolerated well Richmond Campbell, RN

## 2021-01-18 ENCOUNTER — Ambulatory Visit (LOCAL_COMMUNITY_HEALTH_CENTER): Payer: BC Managed Care – PPO

## 2021-01-18 ENCOUNTER — Other Ambulatory Visit: Payer: Self-pay

## 2021-01-18 ENCOUNTER — Other Ambulatory Visit: Payer: Self-pay | Admitting: Obstetrics and Gynecology

## 2021-01-18 DIAGNOSIS — Z23 Encounter for immunization: Secondary | ICD-10-CM

## 2021-01-18 DIAGNOSIS — Z1231 Encounter for screening mammogram for malignant neoplasm of breast: Secondary | ICD-10-CM

## 2021-01-18 LAB — HM PAP SMEAR: HM Pap smear: NEGATIVE

## 2021-01-18 NOTE — Progress Notes (Signed)
Tolerated Twinrix #3 dose well today. Updated [facility] copy given and explained. Jerel Shepherd, RN

## 2021-01-26 ENCOUNTER — Ambulatory Visit
Admission: RE | Admit: 2021-01-26 | Discharge: 2021-01-26 | Disposition: A | Discharge status: Home / Self Care | Payer: BC Managed Care – PPO | Source: Ambulatory Visit | Attending: Obstetrics and Gynecology | Admitting: Obstetrics and Gynecology

## 2021-01-26 ENCOUNTER — Other Ambulatory Visit: Payer: Self-pay

## 2021-01-26 DIAGNOSIS — Z1231 Encounter for screening mammogram for malignant neoplasm of breast: Secondary | ICD-10-CM | POA: Diagnosis present

## 2021-02-08 ENCOUNTER — Ambulatory Visit: Payer: BC Managed Care – PPO | Admitting: Family Medicine

## 2021-02-08 ENCOUNTER — Other Ambulatory Visit: Payer: Self-pay

## 2021-02-08 ENCOUNTER — Encounter: Payer: Self-pay | Admitting: Family Medicine

## 2021-02-08 VITALS — BP 123/75 | HR 87 | Temp 98.2°F | Resp 16 | Wt 235.0 lb

## 2021-02-08 DIAGNOSIS — Z13220 Encounter for screening for lipoid disorders: Secondary | ICD-10-CM | POA: Diagnosis not present

## 2021-02-08 DIAGNOSIS — I872 Venous insufficiency (chronic) (peripheral): Secondary | ICD-10-CM | POA: Diagnosis not present

## 2021-02-08 DIAGNOSIS — Z6841 Body Mass Index (BMI) 40.0 and over, adult: Secondary | ICD-10-CM

## 2021-02-08 NOTE — Progress Notes (Signed)
Established patient visit   Patient: Jennifer Sawyer   DOB: 1960/12/09   60 y.o. Female  MRN: [id number] Visit Date: 02/08/2021  Today's healthcare provider: Dortha Kern, PA-C   Chief Complaint  Patient presents with  . Leg Pain   Subjective    Leg Pain  Incident onset: 8 months ago. There was no injury mechanism. The pain is present in the left leg and right leg.   Patient thinks the pain may be related to prolonged standing on a concrete floor at work. She denies any swelling. Leg pain seems to be gradually improving since onset.   No past medical history on file. Past Surgical History:  Procedure Laterality Date  . ABDOMINAL HYSTERECTOMY     partial  . CYSTECTOMY     ovarian   Social History   Tobacco Use  . Smoking status: Never Smoker  . Smokeless tobacco: Never Used  Substance Use Topics  . Alcohol use: No  . Drug use: No   Family History  Problem Relation Age of Onset  . Healthy Mother   . Breast cancer Mother   . Healthy Father   . Healthy Brother   . Healthy Brother    Allergies  Allergen Reactions  . Codeine   . Hydrocodone Swelling  . Hydrocortisone Swelling       Medications: Outpatient Medications Prior to Visit  Medication Sig  . [DISCONTINUED] cyclobenzaprine (FLEXERIL) 5 MG tablet Take 1 tablet (5 mg total) by mouth 3 (three) times daily as needed for muscle spasms.   No facility-administered medications prior to visit.    Review of Systems  Constitutional: Negative for appetite change, chills, fatigue and fever.  Respiratory: Negative for chest tightness and shortness of breath.   Cardiovascular: Negative for chest pain and palpitations.  Gastrointestinal: Negative for abdominal pain, nausea and vomiting.  Musculoskeletal: Positive for extremity weakness and myalgias (leg pain).  Neurological: Negative for dizziness and weakness.       Objective    BP 123/75 (BP Location: Right Arm, Patient Position: Sitting, Cuff  Size: Large)   Pulse 87   Temp 98.2 F (36.8 C) (Temporal)   Resp 16   Wt 235 lb (106.6 kg)   BMI 39.11 kg/m  BP Readings from Last 3 Encounters:  02/08/21 123/75  06/23/20 (!) 141/83  08/25/18 124/66   Wt Readings from Last 3 Encounters:  02/08/21 235 lb (106.6 kg)  06/23/20 248 lb (112.5 kg)  08/25/18 254 lb 6.4 oz (115.4 kg)       Physical Exam Constitutional:      General: She is not in acute distress.    Appearance: She is well-developed. She is obese.  HENT:     Head: Normocephalic and atraumatic.     Right Ear: Hearing normal.     Left Ear: Hearing normal.     Nose: Nose normal.  Eyes:     General: Lids are normal. No scleral icterus.       Right eye: No discharge.        Left eye: No discharge.     Conjunctiva/sclera: Conjunctivae normal.  Cardiovascular:     Rate and Rhythm: Normal rate and regular rhythm.     Pulses: Normal pulses.     Heart sounds: Normal heart sounds.  Pulmonary:     Effort: Pulmonary effort is normal. No respiratory distress.     Breath sounds: Normal breath sounds.  Abdominal:     General: Bowel sounds  are normal.     Palpations: Abdomen is soft.  Musculoskeletal:        General: Normal range of motion.     Cervical back: Normal range of motion and neck supple.  Skin:    Findings: No lesion or rash.  Neurological:     Mental Status: She is alert and oriented to person, place, and time.  Psychiatric:        Speech: Speech normal.        Behavior: Behavior normal.        Thought Content: Thought content normal.       No results found for any visits on 02/08/21.  Assessment & Plan     1. Venous insufficiency No ulcerations or redness in legs. No pitting edema since using compression stockings. Check labs and continue stockings. - CBC with Differential/Platelet - Comprehensive metabolic panel - TSH  2. Screening for lipid disorders - Comprehensive metabolic panel - Lipid panel - TSH  3. Class 3 severe obesity due to  excess calories without serious comorbidity with body mass index (BMI) of 40.0 to 44.9 in adult [facility]) Check fasting labs for metabolic disorder. Recommend low fat diet at 1600 calories and exercise 30-40 minutes 3-4 days a week. Family positive for diabetes. - CBC with Differential/Platelet - Comprehensive metabolic panel - Lipid panel - TSH - Hemoglobin A1c   No follow-ups on file.      I, Taylen Osorto, PA-C, have reviewed all documentation for this visit. The documentation on 02/08/21 for the exam, diagnosis, procedures, and orders are all accurate and complete.    Dortha Kern, PA-C  [facility] [phone] (phone) [phone] (fax)  [facility]

## 2021-02-16 ENCOUNTER — Telehealth: Payer: Self-pay

## 2021-02-16 LAB — CBC WITH DIFFERENTIAL/PLATELET
Basophils Absolute: 0.1 10*3/uL (ref 0.0–0.2)
Basos: 1 %
EOS (ABSOLUTE): 0.2 10*3/uL (ref 0.0–0.4)
Eos: 3 %
Hematocrit: 36.5 % (ref 34.0–46.6)
Hemoglobin: 12.2 g/dL (ref 11.1–15.9)
Immature Grans (Abs): 0 10*3/uL (ref 0.0–0.1)
Immature Granulocytes: 0 %
Lymphocytes Absolute: 1.8 10*3/uL (ref 0.7–3.1)
Lymphs: 30 %
MCH: 29.5 pg (ref 26.6–33.0)
MCHC: 33.4 g/dL (ref 31.5–35.7)
MCV: 88 fL (ref 79–97)
Monocytes Absolute: 0.6 10*3/uL (ref 0.1–0.9)
Monocytes: 9 %
Neutrophils Absolute: 3.5 10*3/uL (ref 1.4–7.0)
Neutrophils: 57 %
Platelets: 243 10*3/uL (ref 150–450)
RBC: 4.14 x10E6/uL (ref 3.77–5.28)
RDW: 14.2 % (ref 11.7–15.4)
WBC: 6.1 10*3/uL (ref 3.4–10.8)

## 2021-02-16 LAB — COMPREHENSIVE METABOLIC PANEL
ALT: 9 IU/L (ref 0–32)
AST: 16 IU/L (ref 0–40)
Albumin/Globulin Ratio: 1 — ABNORMAL LOW (ref 1.2–2.2)
Albumin: 4.1 g/dL (ref 3.8–4.9)
Alkaline Phosphatase: 56 IU/L (ref 44–121)
BUN/Creatinine Ratio: 14 (ref 9–23)
BUN: 12 mg/dL (ref 6–24)
Bilirubin Total: 0.4 mg/dL (ref 0.0–1.2)
CO2: 22 mmol/L (ref 20–29)
Calcium: 9.1 mg/dL (ref 8.7–10.2)
Chloride: 104 mmol/L (ref 96–106)
Creatinine, Ser: 0.88 mg/dL (ref 0.57–1.00)
Globulin, Total: 4 g/dL (ref 1.5–4.5)
Glucose: 101 mg/dL — ABNORMAL HIGH (ref 65–99)
Potassium: 4.5 mmol/L (ref 3.5–5.2)
Sodium: 140 mmol/L (ref 134–144)
Total Protein: 8.1 g/dL (ref 6.0–8.5)
eGFR: 76 mL/min/{1.73_m2} (ref >59)

## 2021-02-16 LAB — LIPID PANEL
Chol/HDL Ratio: 3.6 ratio (ref 0.0–4.4)
Cholesterol, Total: 153 mg/dL (ref 100–199)
HDL: 43 mg/dL (ref >39)
LDL Chol Calc (NIH): 98 mg/dL (ref 0–99)
Triglycerides: 61 mg/dL (ref 0–149)
VLDL Cholesterol Cal: 12 mg/dL (ref 5–40)

## 2021-02-16 LAB — TSH: TSH: 2.29 u[IU]/mL (ref 0.450–4.500)

## 2021-02-16 LAB — HEMOGLOBIN A1C
Est. average glucose Bld gHb Est-mCnc: 131 mg/dL
Hgb A1c MFr Bld: 6.2 % — ABNORMAL HIGH (ref 4.8–5.6)

## 2021-02-16 NOTE — Telephone Encounter (Signed)
Copied from CRM [id number]. Topic: General - Other >> Feb 16, 2021  4:38 PM Gwenlyn Fudge wrote: Reason for CRM: Pt called and is requesting to have a nurse give a call back to further discuss her lab results and answer questions. Please advise.

## 2021-02-19 NOTE — Telephone Encounter (Signed)
Patient is calling back to receive understanding on labs. CB- [phone]

## 2021-02-20 NOTE — Telephone Encounter (Signed)
See lab result note.

## 2021-02-22 ENCOUNTER — Other Ambulatory Visit: Payer: Self-pay

## 2021-02-22 MED ORDER — METFORMIN HCL 500 MG PO TABS: 500.0000 mg | ORAL_TABLET | Freq: Every day | ORAL | 0 refills | Status: DC

## 2021-05-08 ENCOUNTER — Ambulatory Visit: Payer: Self-pay | Admitting: *Deleted

## 2021-05-08 ENCOUNTER — Other Ambulatory Visit: Payer: Self-pay | Admitting: Family Medicine

## 2021-05-08 DIAGNOSIS — R7303 Prediabetes: Secondary | ICD-10-CM

## 2021-05-08 NOTE — Telephone Encounter (Signed)
Future order in chart for Hgb A1C to be done in the next couple weeks.

## 2021-05-08 NOTE — Telephone Encounter (Signed)
Pt stated she was prescribed metFORMIN (GLUCOPHAGE) 500 MG tablet and would like a nurse to call her back to discuss. Cb# [phone]   Patient is calling to check in regarding repeat lab- needs orders for repeat A1c- after new start metformin. Patient states she is doing good with medication- the only SE- loose stool in morning and afternoon. Please call her to let her know when lab order has been entered so she can go for her lab.    Reason for Disposition . [1] Caller requesting NON-URGENT health information AND [2] PCP's office is the best resource  Protocols used: Information Only Call - No Triage-A-AH

## 2021-05-08 NOTE — Telephone Encounter (Signed)
Patient last seen by Maurine Minister.

## 2021-05-15 ENCOUNTER — Other Ambulatory Visit: Payer: Self-pay

## 2021-05-15 ENCOUNTER — Other Ambulatory Visit: Payer: Self-pay | Admitting: Family Medicine

## 2021-05-15 DIAGNOSIS — R7303 Prediabetes: Secondary | ICD-10-CM

## 2021-05-16 LAB — HEMOGLOBIN A1C
Est. average glucose Bld gHb Est-mCnc: 128 mg/dL
Hgb A1c MFr Bld: 6.1 % — ABNORMAL HIGH (ref 4.8–5.6)

## 2021-05-17 ENCOUNTER — Other Ambulatory Visit: Payer: Self-pay | Admitting: Family Medicine

## 2021-05-17 ENCOUNTER — Other Ambulatory Visit: Payer: Self-pay

## 2021-05-17 MED ORDER — METFORMIN HCL 500 MG PO TABS: 500.0000 mg | ORAL_TABLET | Freq: Every day | ORAL | 1 refills | Status: DC

## 2021-11-12 ENCOUNTER — Telehealth: Payer: Self-pay | Admitting: Physician Assistant

## 2021-11-12 MED ORDER — METFORMIN HCL 500 MG PO TABS: 500.0000 mg | ORAL_TABLET | Freq: Every day | ORAL | 0 refills | Status: DC

## 2021-11-12 NOTE — Telephone Encounter (Signed)
[facility] faxed refill request for the following medications: ° °metFORMIN (GLUCOPHAGE) 500 MG tablet  ° °Please advise. ° °

## 2021-11-12 NOTE — Telephone Encounter (Signed)
Contacted patient but voicemail box was full , patient was last seen in office on 02/08/21, office visit is needed for further refills. Will refill today for a qty of 90 with 0 refills. KW

## 2021-11-16 ENCOUNTER — Encounter: Payer: Self-pay | Admitting: Family Medicine

## 2021-11-16 ENCOUNTER — Telehealth (INDEPENDENT_AMBULATORY_CARE_PROVIDER_SITE_OTHER): Payer: BC Managed Care – PPO | Admitting: Family Medicine

## 2021-11-16 ENCOUNTER — Other Ambulatory Visit: Payer: Self-pay

## 2021-11-16 ENCOUNTER — Ambulatory Visit: Payer: BC Managed Care – PPO | Admitting: Physician Assistant

## 2021-11-16 DIAGNOSIS — R0982 Postnasal drip: Secondary | ICD-10-CM

## 2021-11-16 DIAGNOSIS — R051 Acute cough: Secondary | ICD-10-CM | POA: Diagnosis not present

## 2021-11-16 DIAGNOSIS — R599 Enlarged lymph nodes, unspecified: Secondary | ICD-10-CM | POA: Diagnosis not present

## 2021-11-16 DIAGNOSIS — J00 Acute nasopharyngitis [common cold]: Secondary | ICD-10-CM | POA: Diagnosis not present

## 2021-11-16 DIAGNOSIS — J0140 Acute pansinusitis, unspecified: Secondary | ICD-10-CM | POA: Diagnosis not present

## 2021-11-16 MED ORDER — FLUTICASONE PROPIONATE 50 MCG/ACT NA SUSP: 2.0000 | Freq: Every day | NASAL | 6 refills | Status: DC

## 2021-11-16 MED ORDER — AZITHROMYCIN 250 MG PO TABS
ORAL_TABLET | ORAL | 0 refills | Status: AC
Start: 2021-11-16 — End: 2021-11-21

## 2021-11-16 MED ORDER — CETIRIZINE HCL 10 MG PO TABS: 10.0000 mg | ORAL_TABLET | Freq: Every day | ORAL | 3 refills | Status: DC

## 2021-11-16 MED ORDER — PREDNISONE 10 MG (21) PO TBPK: ORAL_TABLET | ORAL | 0 refills | Status: DC

## 2021-11-16 MED ORDER — DOXYCYCLINE HYCLATE 100 MG PO TABS: 100.0000 mg | ORAL_TABLET | Freq: Two times a day (BID) | ORAL | 0 refills | Status: DC

## 2021-11-16 MED ORDER — GUAIFENESIN-DM 100-10 MG/5ML PO SYRP
5.0000 mL | ORAL_SOLUTION | ORAL | 1 refills | Status: DC | PRN
Start: 2021-11-16 — End: 2022-01-09

## 2021-11-16 NOTE — Assessment & Plan Note (Signed)
Some difficulty sleeping; not relieved by tessalon

## 2021-11-16 NOTE — Progress Notes (Signed)
MyChart Video Visit    Virtual Visit via Video Note   This visit type was conducted due to national recommendations for restrictions regarding the COVID-19 Pandemic (e.g. social distancing) in an effort to limit this patient's exposure and mitigate transmission in our community. This patient is at least at moderate risk for complications without adequate follow up. This format is felt to be most appropriate for this patient at this time. Physical exam was limited by quality of the video and audio technology used for the visit.   Patient location: home, couch Provider location: [facility]  I discussed the limitations of evaluation and management by telemedicine and the availability of in person appointments. The patient expressed understanding and agreed to proceed.  Patient: Jennifer Sawyer   DOB: 26-Jul-1961   60 y.o. Female  MRN: [id number] Visit Date: 11/16/2021  Today's healthcare provider: Jacky Kindle, FNP   Chief Complaint  Patient presents with   URI   I,Sulibeya S Dimas,acting as a scribe for Jacky Kindle, FNP.,have documented all relevant documentation on the behalf of Jacky Kindle, FNP,as directed by  Jacky Kindle, FNP while in the presence of Jacky Kindle, FNP.  Subjective    HPI  Upper respiratory symptoms She complains of congestion, nasal congestion, non productive cough, and sore throat.with no fever, chills, night sweats or weight loss. Onset of symptoms was  2 weeks ago and staying constant.She is drinking plenty of fluids.  Past history is significant for no history of pneumonia or bronchitis. Patient is non-smoker  ---------------------------------------------------------------------------------------------------    Medications: Outpatient Medications Prior to Visit  Medication Sig   benzonatate (TESSALON) 200 MG capsule Take 200 mg by mouth 3 (three) times daily as needed.   ipratropium (ATROVENT) 0.03 % nasal spray SMARTSIG:2 Spray(s) Both Nares 4 Times  Daily PRN   metFORMIN (GLUCOPHAGE) 500 MG tablet Take 1 tablet (500 mg total) by mouth daily with breakfast. For further refills please contact office and schedule follow up visit.   No facility-administered medications prior to visit.    Review of Systems     Objective    There were no vitals taken for this visit. BP Readings from Last 3 Encounters:  02/08/21 123/75  06/23/20 (!) 141/83  08/25/18 124/66   Wt Readings from Last 3 Encounters:  02/08/21 235 lb (106.6 kg)  06/23/20 248 lb (112.5 kg)  08/25/18 254 lb 6.4 oz (115.4 kg)      Physical Exam Constitutional:      Appearance: Normal appearance.  HENT:     Nose: Congestion and rhinorrhea present.     Mouth/Throat:     Mouth: Mucous membranes are moist.     Pharynx: No oropharyngeal exudate.  Pulmonary:     Effort: Pulmonary effort is normal.  Neurological:     Mental Status: She is alert.  Psychiatric:        Mood and Affect: Mood normal.        Behavior: Behavior normal.        Thought Content: Thought content normal.        Judgment: Judgment normal.       Assessment & Plan     Problem List Items Addressed This Visit       Respiratory   Acute non-recurrent pansinusitis - Primary    >2 wks of concerns; slight improvement with Atrovent nasal spray; however, still feels 'full of the green stuff'      Relevant Medications   ipratropium (  ATROVENT) 0.03 % nasal spray   benzonatate (TESSALON) 200 MG capsule   predniSONE (STERAPRED UNI-PAK 21 TAB) 10 MG (21) TBPK tablet   doxycycline (VIBRA-TABS) 100 MG tablet   azithromycin (ZITHROMAX) 250 MG tablet   fluticasone (FLONASE) 50 MCG/ACT nasal spray   cetirizine (ZYRTEC) 10 MG tablet   guaiFENesin-dextromethorphan (ROBITUSSIN DM) 100-10 MG/5ML syrup   Acute rhinitis    Was told red nose and red throat last w/e at [facility]; encourage use of antihistamines to support      Relevant Medications   fluticasone (FLONASE) 50 MCG/ACT nasal spray   cetirizine  (ZYRTEC) 10 MG tablet     Immune and Lymphatic   Glands swollen    Use of steroid taper to assist with opening glands for drainage Recommend use of topical heat and light hand massage prn      Relevant Medications   predniSONE (STERAPRED UNI-PAK 21 TAB) 10 MG (21) TBPK tablet     Other   Acute cough    Some difficulty sleeping; not relieved by tessalon       Relevant Medications   guaiFENesin-dextromethorphan (ROBITUSSIN DM) 100-10 MG/5ML syrup   PND (post-nasal drip)    Ongoing concern given sinusitis       Relevant Medications   fluticasone (FLONASE) 50 MCG/ACT nasal spray   cetirizine (ZYRTEC) 10 MG tablet   guaiFENesin-dextromethorphan (ROBITUSSIN DM) 100-10 MG/5ML syrup     Return if symptoms worsen or fail to improve; however, keep appt in May for DM f/u.     I discussed the assessment and treatment plan with the patient. The patient was provided an opportunity to ask questions and all were answered. The patient agreed with the plan and demonstrated an understanding of the instructions.   The patient was advised to call back or seek an in-person evaluation if the symptoms worsen or if the condition fails to improve as anticipated.  I provided 9 minutes of non-face-to-face time during this encounter.  Leilani Merl, FNP, have reviewed all documentation for this visit. The documentation on 11/16/21 for the exam, diagnosis, procedures, and orders are all accurate and complete.   Jacky Kindle, FNP [facility] [phone] (phone) [phone] (fax)  [facility]

## 2021-11-16 NOTE — Assessment & Plan Note (Signed)
Ongoing concern given sinusitis

## 2021-11-16 NOTE — Assessment & Plan Note (Signed)
>  2 wks of concerns; slight improvement with Atrovent nasal spray; however, still feels 'full of the green stuff'

## 2021-11-16 NOTE — Assessment & Plan Note (Signed)
Was told red nose and red throat last w/e at [facility]; encourage use of antihistamines to support

## 2021-11-16 NOTE — Assessment & Plan Note (Signed)
Use of steroid taper to assist with opening glands for drainage Recommend use of topical heat and light hand massage prn

## 2022-01-08 NOTE — Progress Notes (Signed)
? ?I,Sha'taria Tyson,acting as a Education administrator for [employer], PA-C.,have documented all relevant documentation on the behalf of Jennifer Kirschner, PA-C,as directed by  Jennifer Kirschner, PA-C while in the presence of Jennifer Kirschner, PA-C. ? ?Established Patient Office Visit ? ?Subjective:  ?Patient ID: Jennifer Sawyer, female    DOB: 01-Oct-1960  Age: 61 y.o. MRN: [id number] ? ?CC: DMII f/u ? ? ?HPI ?Jennifer Sawyer presents for medication refill. ? ?She also reports a new callous b/l great toes after going back to work and standing more often. She also reports changes to R great toenail--hard, dark, falling off. ? ?Diabetes Mellitus Type II, Follow-up ? ?Lab Results  ?Component Value Date  ? HGBA1C 5.7 (A) 01/09/2022  ? HGBA1C 6.1 (H) 05/15/2021  ? HGBA1C 6.2 (H) 02/15/2021  ? ?Wt Readings from Last 3 Encounters:  ?01/09/22 230 lb 9.6 oz (104.6 kg)  ?02/08/21 235 lb (106.6 kg)  ?06/23/20 248 lb (112.5 kg)  ? ?Last seen for diabetes 11 months ago.  ?Management since then includes start metformin 500 mg qd. ?She reports excellent compliance with treatment. ?She is not having side effects.  ?Symptoms: ?No fatigue No foot ulcerations  ?No appetite changes No nausea  ?No paresthesia of the feet  No polydipsia  ?No polyuria No visual disturbances   ?No vomiting   ? ?Episodes of hypoglycemia? No  ?  ?Most Recent Eye Exam: Jan 23 ?Current exercise: none ?Current diet habits: no fried foods and no pork and watch bread intake as well as no eggs ?Current supplements:  ?Fish oil  ?Multivit  ?Garlic ?Calcium 600 D3 50 ?Tumeric ?Cinnamon ?Vit c ?Apple cider vinegar  ?Pertinent Labs: ?Lab Results  ?Component Value Date  ? CHOL 153 02/15/2021  ? HDL 43 02/15/2021  ? [place] 98 02/15/2021  ? TRIG 61 02/15/2021  ? CHOLHDL 3.6 02/15/2021  ? Lab Results  ?Component Value Date  ? NA 140 02/15/2021  ? K 4.5 02/15/2021  ? CREATININE 0.88 02/15/2021  ? EGFR 76 02/15/2021  ?   ? ?---------------------------------------------------------------------------------------------------  ? ?History reviewed. No pertinent past medical history. ? ?Past Surgical History:  ?Procedure Laterality Date  ? ABDOMINAL HYSTERECTOMY    ? partial  ? CYSTECTOMY    ? ovarian  ? ? ?Family History  ?Problem Relation Age of Onset  ? Healthy Mother   ? Breast cancer Mother   ? Healthy Father   ? Healthy Brother   ? Healthy Brother   ? ? ?Social History  ? ?Socioeconomic History  ? Marital status: Single  ?  Spouse name: Not on file  ? Number of children: 0  ? Years of education: associates  ? Highest education level: Not on file  ?Occupational History  ?  Employer: [employer]  ?Tobacco Use  ? Smoking status: Never  ? Smokeless tobacco: Never  ?Substance and Sexual Activity  ? Alcohol use: No  ? Drug use: No  ? Sexual activity: Not Currently  ?Other Topics Concern  ? Not on file  ?Social History Narrative  ? Pt has niece who currently lives with pt.  ? ?Social Determinants of Health  ? ?Financial Resource Strain: Not on file  ?Food Insecurity: Not on file  ?Transportation Needs: Not on file  ?Physical Activity: Not on file  ?Stress: Not on file  ?Social Connections: Not on file  ?Intimate Partner Violence: Not on file  ? ? ?Outpatient Medications Prior to Visit  ?Medication Sig Dispense Refill  ? ipratropium (ATROVENT) 0.03 % nasal  spray SMARTSIG:2 Spray(s) Both Nares 4 Times Daily PRN    ? metFORMIN (GLUCOPHAGE) 500 MG tablet Take 1 tablet (500 mg total) by mouth daily with breakfast. For further refills please contact office and schedule follow up visit. 90 tablet 0  ? benzonatate (TESSALON) 200 MG capsule Take 200 mg by mouth 3 (three) times daily as needed.    ? cetirizine (ZYRTEC) 10 MG tablet Take 1 tablet (10 mg total) by mouth daily. 90 tablet 3  ? doxycycline (VIBRA-TABS) 100 MG tablet Take 1 tablet (100 mg total) by mouth 2 (two) times daily. 10 tablet 0  ? fluticasone (FLONASE) 50 MCG/ACT nasal  spray Place 2 sprays into both nostrils daily. 16 g 6  ? guaiFENesin-dextromethorphan (ROBITUSSIN DM) 100-10 MG/5ML syrup Take 5 mLs by mouth every 4 (four) hours as needed for cough. 118 mL 1  ? predniSONE (STERAPRED UNI-PAK 21 TAB) 10 MG (21) TBPK tablet Take as directed on package. Take with morning meal. 1 each 0  ? ?No facility-administered medications prior to visit.  ? ? ?Allergies  ?Allergen Reactions  ? Codeine   ? Hydrocodone Swelling  ? Hydrocortisone Swelling  ? ? ?ROS ?Review of Systems  ?Constitutional:  Negative for fatigue and fever.  ?Respiratory:  Negative for cough and shortness of breath.   ?Cardiovascular:  Negative for chest pain and leg swelling.  ?Gastrointestinal:  Negative for abdominal pain.  ?Skin:  Positive for color change.  ?Neurological:  Negative for dizziness and headaches.  ? ?  ?Objective:  ?  ?Physical Exam ?Constitutional:   ?   Appearance: Normal appearance. She is not ill-appearing.  ?HENT:  ?   Head: Normocephalic.  ?Eyes:  ?   Conjunctiva/sclera: Conjunctivae normal.  ?Cardiovascular:  ?   Rate and Rhythm: Normal rate and regular rhythm.  ?   Pulses: Normal pulses.  ?   Heart sounds: Normal heart sounds.  ?Pulmonary:  ?   Effort: Pulmonary effort is normal.  ?   Breath sounds: Normal breath sounds.  ?Feet:  ?   Right foot:  ?   Skin integrity: Callus present.  ?   Toenail Condition: Right toenails are abnormally thick and long.  ?   Left foot:  ?   Skin integrity: Callus present.  ?Neurological:  ?   Mental Status: She is oriented to person, place, and time.  ?Psychiatric:     ?   Mood and Affect: Mood normal.     ?   Behavior: Behavior normal.  ? ? ?BP 124/75   Pulse 79   Ht 5' 5"  (1.651 m)   Wt 230 lb 9.6 oz (104.6 kg)   SpO2 97%   BMI 38.37 kg/m?  ?Wt Readings from Last 3 Encounters:  ?01/09/22 230 lb 9.6 oz (104.6 kg)  ?02/08/21 235 lb (106.6 kg)  ?06/23/20 248 lb (112.5 kg)  ? ? ? ?Health Maintenance Due  ?Topic Date Due  ? HIV Screening  Never done  ? Hepatitis C  Screening  Never done  ? Zoster Vaccines- Shingrix (1 of 2) Never done  ? PAP SMEAR-Modifier  03/11/2021  ? TETANUS/TDAP  07/31/2021  ? ? ?There are no preventive care reminders to display for this patient. ? ?Lab Results  ?Component Value Date  ? TSH 2.290 02/15/2021  ? ?Lab Results  ?Component Value Date  ? WBC 6.1 02/15/2021  ? HGB 12.2 02/15/2021  ? HCT 36.5 02/15/2021  ? MCV 88 02/15/2021  ? PLT 243 02/15/2021  ? ?Lab  Results  ?Component Value Date  ? NA 140 02/15/2021  ? K 4.5 02/15/2021  ? CO2 22 02/15/2021  ? GLUCOSE 101 (H) 02/15/2021  ? BUN 12 02/15/2021  ? CREATININE 0.88 02/15/2021  ? BILITOT 0.4 02/15/2021  ? ALKPHOS 56 02/15/2021  ? AST 16 02/15/2021  ? ALT 9 02/15/2021  ? PROT 8.1 02/15/2021  ? ALBUMIN 4.1 02/15/2021  ? CALCIUM 9.1 02/15/2021  ? EGFR 76 02/15/2021  ? ?Lab Results  ?Component Value Date  ? CHOL 153 02/15/2021  ? ?Lab Results  ?Component Value Date  ? HDL 43 02/15/2021  ? ?Lab Results  ?Component Value Date  ? [place] 98 02/15/2021  ? ?Lab Results  ?Component Value Date  ? TRIG 61 02/15/2021  ? ?Lab Results  ?Component Value Date  ? CHOLHDL 3.6 02/15/2021  ? ?Lab Results  ?Component Value Date  ? HGBA1C 5.7 (A) 01/09/2022  ? ? ?  ?Assessment & Plan:  ? ?Problem List Items Addressed This Visit   ? ?  ? Musculoskeletal and Integument  ? Pre-ulcerative corn or callous  ?  Ref to podiatry ?  ?  ? Relevant Orders  ? Ambulatory referral to Podiatry  ?  ? Other  ? Obesity  ?  Discussed diet/exercise ?  ?  ? Relevant Medications  ? metFORMIN (GLUCOPHAGE) 500 MG tablet  ? Screening for lipid disorders  ? Relevant Orders  ? Comprehensive Metabolic Panel (CMET)  ? Lipid Profile  ? Prediabetes - Primary  ?  A1c improved today, 5.7% ?Advised to continue Metformin 500 mg daily at breakfast. ? ?  ?  ? Relevant Medications  ? metFORMIN (GLUCOPHAGE) 500 MG tablet  ? Other Relevant Orders  ? POCT HgB A1C (Completed)  ? Comprehensive Metabolic Panel (CMET)  ?  ? ?Follow-up: Return in about 6 months  (around 07/11/2022) for CPE.  ? ? ?I, Jennifer Kirschner, PA-C have reviewed all documentation for this visit. The documentation on  01/09/2022  for the exam, diagnosis, procedures, and orders are all accurate and complete. ? ?Jennifer Kirschner, PA-C ?[place] ?[place]

## 2022-01-09 ENCOUNTER — Ambulatory Visit: Payer: BC Managed Care – PPO | Admitting: Physician Assistant

## 2022-01-09 ENCOUNTER — Encounter: Payer: Self-pay | Admitting: Physician Assistant

## 2022-01-09 VITALS — BP 124/75 | HR 79 | Ht 65.0 in | Wt 230.6 lb

## 2022-01-09 DIAGNOSIS — E6609 Other obesity due to excess calories: Secondary | ICD-10-CM | POA: Diagnosis not present

## 2022-01-09 DIAGNOSIS — Z13220 Encounter for screening for lipoid disorders: Secondary | ICD-10-CM | POA: Diagnosis not present

## 2022-01-09 DIAGNOSIS — R7303 Prediabetes: Secondary | ICD-10-CM

## 2022-01-09 DIAGNOSIS — L84 Corns and callosities: Secondary | ICD-10-CM

## 2022-01-09 DIAGNOSIS — Z6838 Body mass index (BMI) 38.0-38.9, adult: Secondary | ICD-10-CM

## 2022-01-09 LAB — POCT GLYCOSYLATED HEMOGLOBIN (HGB A1C): Hemoglobin A1C: 5.7 % — AB (ref 4.0–5.6)

## 2022-01-09 MED ORDER — METFORMIN HCL 500 MG PO TABS: 500.0000 mg | ORAL_TABLET | Freq: Every day | ORAL | 2 refills | Status: DC

## 2022-01-09 NOTE — Assessment & Plan Note (Signed)
A1c improved today, 5.7% ?Advised to continue Metformin 500 mg daily at breakfast. ? ?

## 2022-01-09 NOTE — Assessment & Plan Note (Signed)
Ref to podiatry

## 2022-01-09 NOTE — Assessment & Plan Note (Signed)
Discussed diet/exercise 

## 2022-01-10 ENCOUNTER — Ambulatory Visit: Payer: BC Managed Care – PPO | Admitting: Podiatry

## 2022-01-10 DIAGNOSIS — B351 Tinea unguium: Secondary | ICD-10-CM

## 2022-01-10 DIAGNOSIS — Z79899 Other long term (current) drug therapy: Secondary | ICD-10-CM | POA: Diagnosis not present

## 2022-01-10 DIAGNOSIS — Q666 Other congenital valgus deformities of feet: Secondary | ICD-10-CM

## 2022-01-10 LAB — COMPREHENSIVE METABOLIC PANEL
ALT: 11 IU/L (ref 0–32)
AST: 20 IU/L (ref 0–40)
Albumin/Globulin Ratio: 1 — ABNORMAL LOW (ref 1.2–2.2)
Albumin: 4 g/dL (ref 3.8–4.9)
Alkaline Phosphatase: 55 IU/L (ref 44–121)
BUN/Creatinine Ratio: 15 (ref 12–28)
BUN: 13 mg/dL (ref 8–27)
Bilirubin Total: 0.4 mg/dL (ref 0.0–1.2)
CO2: 24 mmol/L (ref 20–29)
Calcium: 9.2 mg/dL (ref 8.7–10.3)
Chloride: 104 mmol/L (ref 96–106)
Creatinine, Ser: 0.85 mg/dL (ref 0.57–1.00)
Globulin, Total: 4 g/dL (ref 1.5–4.5)
Glucose: 97 mg/dL (ref 70–99)
Potassium: 4.6 mmol/L (ref 3.5–5.2)
Sodium: 139 mmol/L (ref 134–144)
Total Protein: 8 g/dL (ref 6.0–8.5)
eGFR: 78 mL/min/{1.73_m2} (ref >59)

## 2022-01-10 LAB — LIPID PANEL
Chol/HDL Ratio: 3.6 ratio (ref 0.0–4.4)
Cholesterol, Total: 164 mg/dL (ref 100–199)
HDL: 46 mg/dL (ref >39)
LDL Chol Calc (NIH): 102 mg/dL — ABNORMAL HIGH (ref 0–99)
Triglycerides: 86 mg/dL (ref 0–149)
VLDL Cholesterol Cal: 16 mg/dL (ref 5–40)

## 2022-01-10 MED ORDER — TERBINAFINE HCL 250 MG PO TABS: 250.0000 mg | ORAL_TABLET | Freq: Every day | ORAL | 0 refills | Status: DC

## 2022-01-10 NOTE — Progress Notes (Signed)
?Subjective:  ?Patient ID: Jennifer Sawyer, female    DOB: [birth year],  MRN: [id number] ? ?Chief Complaint  ?Patient presents with  ? Nail Problem  ? Callouses  ? ? ?61 y.o. female presents with the above complaint.  Patient presents with bilateral hallux nail dystrophy/onychomycosis.  She states that has been present for quite some time is progressive gotten worse.  Is been going for 1 month.  She would like to discuss treatment options for nail fungus.  She has not tried anything for it.  She has secondary complaint of flatfoot deformity.  She does not wear any orthotics.  She does not wear any supportive shoes.  She has not seen and was prior to seeing me for this as well. ? ? ?Review of Systems: Negative except as noted in the HPI. Denies N/V/F/Ch. ? ?No past medical history on file. ? ?Current Outpatient Medications:  ?  terbinafine (LAMISIL) 250 MG tablet, Take 1 tablet (250 mg total) by mouth daily., Disp: 90 tablet, Rfl: 0 ?  ipratropium (ATROVENT) 0.03 % nasal spray, SMARTSIG:2 Spray(s) Both Nares 4 Times Daily PRN, Disp: , Rfl:  ?  metFORMIN (GLUCOPHAGE) 500 MG tablet, Take 1 tablet (500 mg total) by mouth daily with breakfast. For further refills please contact office and schedule follow up visit., Disp: 90 tablet, Rfl: 2 ? ?Social History  ? ?Tobacco Use  ?Smoking Status Never  ?Smokeless Tobacco Never  ? ? ?Allergies  ?Allergen Reactions  ? Codeine   ? Hydrocodone Swelling  ? Hydrocortisone Swelling  ? ?Objective:  ?There were no vitals filed for this visit. ?There is no height or weight on file to calculate BMI. ?Constitutional Well developed. ?Well nourished.  ?Vascular Dorsalis pedis pulses palpable bilaterally. ?Posterior tibial pulses palpable bilaterally. ?Capillary refill normal to all digits.  ?No cyanosis or clubbing noted. ?Pedal hair growth normal.  ?Neurologic Normal speech. ?Oriented to person, place, and time. ?Epicritic sensation to light touch grossly present bilaterally.  ?Dermatologic  Thickened elongated dystrophic mycotic discolored toenails x2 bilateral hallux.  Mild pain on palpation.  Mycotic nature to it ?No open wounds. ?No skin lesions.  ?Orthopedic: Gait examination shows pes planovalgus foot structure with calcaneovalgus unable to recruit the arch with dorsiflexion of the hallux.  Unable to perform single and double heel raise.  ? ?Radiographs: None ?Assessment:  ? ?1. Pes planovalgus   ?2. Nail fungus   ?3. Onychomycosis due to dermatophyte   ?4. Long-term use of high-risk medication   ? ?Plan:  ?Patient was evaluated and treated and all questions answered. ? ?Bilateral hallux onychomycosis ?-Educated the patient on the etiology of onychomycosis and various treatment options associated with improving the fungal load.  I explained to the patient that there is 3 treatment options available to treat the onychomycosis including topical, p.o., laser treatment.  Patient elected to undergo p.o. options with Lamisil/terbinafine therapy.  In order for me to start the medication therapy, I explained to the patient the importance of evaluating the liver and obtaining the liver function test.  Once the liver function test comes back normal I will start him on 21-month course of Lamisil therapy.  Patient understood all risk and would like to proceed with Lamisil therapy.  I have asked the patient to immediately stop the Lamisil therapy if she has any reactions to it and call the office or go to the emergency room right away.  Patient states understanding ? ?Pes planovalgus ?-I explained to patient the etiology of pes planovalgus and  relationship with Planter fasciitis and various treatment options were discussed.  Given patient foot structure in the setting of Planter fasciitis I believe patient will benefit from custom-made orthotics to help control the hindfoot motion support the arch of the foot and take the stress away from plantar fascial.  Patient agrees with the plan like to proceed with  orthotics ?-Patient will be scheduled Aaron Edelman for orthotics ? ? ?No follow-ups on file.  ?

## 2022-01-29 ENCOUNTER — Ambulatory Visit: Payer: BC Managed Care – PPO | Admitting: Physician Assistant

## 2022-02-01 ENCOUNTER — Other Ambulatory Visit: Payer: BC Managed Care – PPO

## 2022-02-04 ENCOUNTER — Other Ambulatory Visit: Payer: BC Managed Care – PPO

## 2022-02-18 ENCOUNTER — Other Ambulatory Visit: Payer: BC Managed Care – PPO

## 2022-03-12 ENCOUNTER — Other Ambulatory Visit: Payer: Self-pay | Admitting: Obstetrics and Gynecology

## 2022-03-12 DIAGNOSIS — Z1231 Encounter for screening mammogram for malignant neoplasm of breast: Secondary | ICD-10-CM

## 2022-03-15 ENCOUNTER — Ambulatory Visit: Payer: BC Managed Care – PPO

## 2022-03-15 DIAGNOSIS — Q666 Other congenital valgus deformities of feet: Secondary | ICD-10-CM

## 2022-03-15 NOTE — Progress Notes (Signed)
SITUATION Reason for Consult: Evaluation for Bilateral Custom Foot Orthoses Patient / Caregiver Report: Patient is ready for foot orthotics  OBJECTIVE DATA: Patient History / Diagnosis:    ICD-10-CM   1. Pes planovalgus  Q66.6       Current or Previous Devices:   None and no history  Foot Examination: Skin presentation:   Intact Ulcers & Callousing:   none Toe / Foot Deformities:  Pes planus Weight Bearing Presentation:  planus Sensation:    intact  Shoe Size:    10  ORTHOTIC RECOMMENDATION Recommended Device: 1x pair of custom functional foot orthotics  GOALS OF ORTHOSES - Reduce Pain - Prevent Foot Deformity - Prevent Progression of Further Foot Deformity - Relieve Pressure - Improve the Overall Biomechanical Function of the Foot and Lower Extremity.  ACTIONS PERFORMED Potential out of pocket cost was communicated to patient. Patient understood and consent to casting. Patient was casted for Foot Orthoses via crush box. Procedure was explained and patient tolerated procedure well. Casts were shipped to central fabrication. All questions were answered and concerns addressed.  PLAN Patient is to be called for fitting when devices are ready.

## 2022-04-05 ENCOUNTER — Ambulatory Visit
Admission: RE | Admit: 2022-04-05 | Discharge: 2022-04-05 | Disposition: A | Discharge status: Home / Self Care | Payer: BC Managed Care – PPO | Source: Ambulatory Visit | Attending: Obstetrics and Gynecology | Admitting: Obstetrics and Gynecology

## 2022-04-05 DIAGNOSIS — Z1231 Encounter for screening mammogram for malignant neoplasm of breast: Secondary | ICD-10-CM

## 2022-04-26 ENCOUNTER — Ambulatory Visit: Payer: BC Managed Care – PPO | Admitting: Podiatry

## 2022-04-26 DIAGNOSIS — Q666 Other congenital valgus deformities of feet: Secondary | ICD-10-CM

## 2022-04-26 NOTE — Patient Instructions (Signed)

## 2022-04-26 NOTE — Progress Notes (Signed)
Patient presents to pick up orthotics.  The orthotics fit well.  Wearing instructions were given.  The patient was advised to come back as needed and to call if she has any issues.

## 2022-05-14 ENCOUNTER — Ambulatory Visit: Payer: BC Managed Care – PPO | Admitting: Podiatry

## 2022-05-16 ENCOUNTER — Ambulatory Visit: Payer: BC Managed Care – PPO | Admitting: Podiatry

## 2022-07-11 ENCOUNTER — Encounter: Payer: BC Managed Care – PPO | Admitting: Physician Assistant

## 2022-07-17 ENCOUNTER — Encounter: Payer: BC Managed Care – PPO | Admitting: Physician Assistant

## 2022-07-24 ENCOUNTER — Encounter: Payer: BC Managed Care – PPO | Admitting: Physician Assistant

## 2022-08-06 ENCOUNTER — Encounter: Payer: BC Managed Care – PPO | Admitting: Physician Assistant

## 2022-08-20 NOTE — Progress Notes (Deleted)
Complete physical exam   Patient: Jennifer Sawyer   DOB: 12-15-1960   60 y.o. Female  MRN: [id number] Visit Date: 08/21/2022  Today's healthcare provider: Alfredia Ferguson, PA-C   No chief complaint on file.  Subjective    Jennifer Sawyer is a 61 y.o. female who presents today for a complete physical exam.  She reports consuming a {diet types:17450} diet. {Exercise:19826} She generally feels {well/fairly well/poorly:18703}. She reports sleeping {well/fairly well/poorly:18703}. She {does/does not:200015} have additional problems to discuss today.  HPI  ***  No past medical history on file. Past Surgical History:  Procedure Laterality Date   ABDOMINAL HYSTERECTOMY     partial   CYSTECTOMY     ovarian   Social History   Socioeconomic History   Marital status: Single    Spouse name: Not on file   Number of children: 0   Years of education: associates   Highest education level: Not on file  Occupational History    Employer: [employer]  Tobacco Use   Smoking status: Never   Smokeless tobacco: Never  Substance and Sexual Activity   Alcohol use: No   Drug use: No   Sexual activity: Not Currently  Other Topics Concern   Not on file  Social History Narrative   Pt has niece who currently lives with pt.   Social Determinants of Health   Financial Resource Strain: Not on file  Food Insecurity: Not on file  Transportation Needs: Not on file  Physical Activity: Not on file  Stress: Not on file  Social Connections: Not on file  Intimate Partner Violence: Not on file   Family Status  Relation Name Status   Mother  Alive   Father  Alive   Brother  Alive   Brother  Alive   Family History  Problem Relation Age of Onset   Healthy Mother    Breast cancer Mother    Healthy Father    Healthy Brother    Healthy Brother    Allergies  Allergen Reactions   Codeine    Hydrocodone Swelling   Hydrocortisone Swelling    Patient Care Team: Alfredia Ferguson,  PA-C as PCP - General (Physician Assistant)   Medications: Outpatient Medications Prior to Visit  Medication Sig   ipratropium (ATROVENT) 0.03 % nasal spray SMARTSIG:2 Spray(s) Both Nares 4 Times Daily PRN   metFORMIN (GLUCOPHAGE) 500 MG tablet Take 1 tablet (500 mg total) by mouth daily with breakfast. For further refills please contact office and schedule follow up visit.   terbinafine (LAMISIL) 250 MG tablet Take 1 tablet (250 mg total) by mouth daily.   No facility-administered medications prior to visit.    Review of Systems  {Labs  Heme  Chem  Endocrine  Serology  Results Review (optional):23779}  Objective    There were no vitals taken for this visit. {Show previous vital signs (optional):23777}   Physical Exam  ***  Last depression screening scores    01/09/2022    9:34 AM 06/23/2020    2:46 PM 08/25/2018    9:09 AM  PHQ 2/9 Scores  PHQ - 2 Score 0 0 0  PHQ- 9 Score 0 0 0   Last fall risk screening    01/09/2022    9:34 AM  Fall Risk   Falls in the past year? 0  Injury with Fall? 0  Risk for fall due to : No Fall Risks   Last Audit-C alcohol use screening  01/09/2022    9:34 AM  Alcohol Use Disorder Test (AUDIT)  1. How often do you have a drink containing alcohol? 0  2. How many drinks containing alcohol do you have on a typical day when you are drinking? 0  3. How often do you have six or more drinks on one occasion? 0  AUDIT-C Score 0   A score of 3 or more in women, and 4 or more in men indicates increased risk for alcohol abuse, EXCEPT if all of the points are from question 1   No results found for any visits on 08/21/22.  Assessment & Plan    Routine Health Maintenance and Physical Exam  Exercise Activities and Dietary recommendations  Goals   None     Immunization History  Administered Date(s) Administered   Hep A / Hep B 07/04/2020, 08/03/2020, 01/18/2021   Influenza-Unspecified 07/01/2015, 07/09/2016, 07/04/2020   PPD Test  07/04/2020   Tdap 08/01/2011    Health Maintenance  Topic Date Due   COVID-19 Vaccine (1) Never done   HIV Screening  Never done   Hepatitis C Screening  Never done   Zoster Vaccines- Shingrix (1 of 2) Never done   PAP SMEAR-Modifier  03/11/2021   COLONOSCOPY (Pts 45-72yrs Insurance coverage will need to be confirmed)  05/08/2023   MAMMOGRAM  04/05/2024   HPV VACCINES  Aged Out   INFLUENZA VACCINE  Discontinued    Discussed health benefits of physical activity, and encouraged her to engage in regular exercise appropriate for her age and condition.  ***  No follow-ups on file.     {provider attestation***:1}   Alfredia Ferguson, PA-C  [facility] [phone] (phone) [phone] (fax)  [facility]

## 2022-08-21 ENCOUNTER — Telehealth: Payer: Self-pay

## 2022-08-21 ENCOUNTER — Encounter: Payer: BC Managed Care – PPO | Admitting: Physician Assistant

## 2022-08-21 NOTE — Telephone Encounter (Signed)
Copied from CRM [id number]. Topic: Appointment Scheduling - Scheduling Inquiry for Clinic >> Aug 21, 2022 10:45 AM Macon Large wrote: Reason for CRM: Pt reports that she is running a little behind and will be late for her appt. Pt informed of late policy.

## 2022-08-27 IMAGING — MG MM DIGITAL SCREENING BILAT W/ TOMO AND CAD
8 series · 8 of 24 positions shown · non-contrast
Comparison: Previous exam(s).

CLINICAL DATA: Screening.

EXAM:
DIGITAL SCREENING BILATERAL MAMMOGRAM WITH TOMOSYNTHESIS AND CAD
TECHNIQUE: Bilateral screening digital craniocaudal and mediolateral oblique
mammograms were obtained. Bilateral screening digital breast
tomosynthesis was performed. The images were evaluated with
computer-aided detection.

[L CC synth-2D]
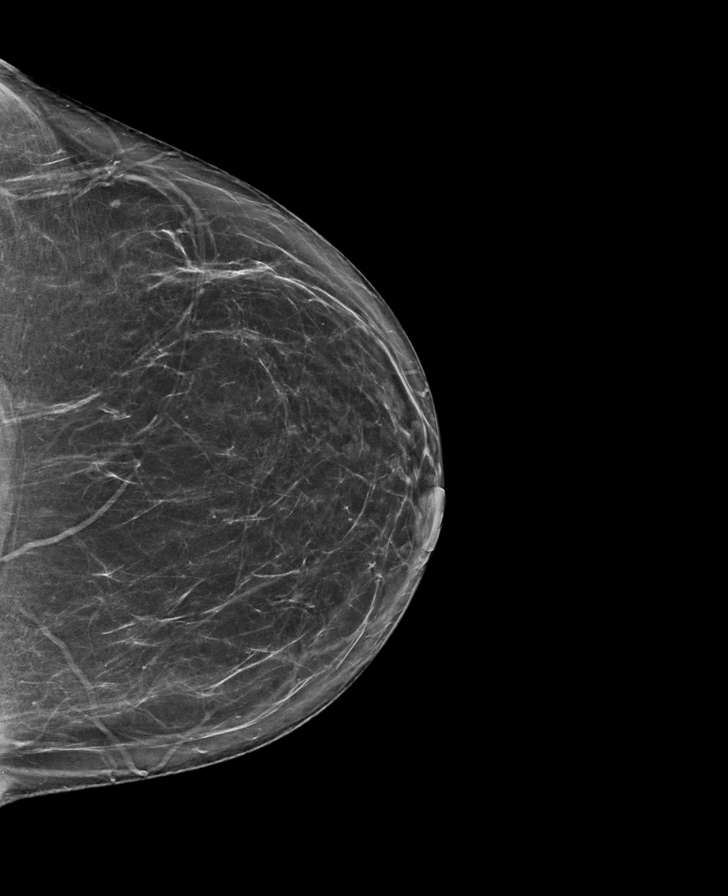

[R CC synth-2D]
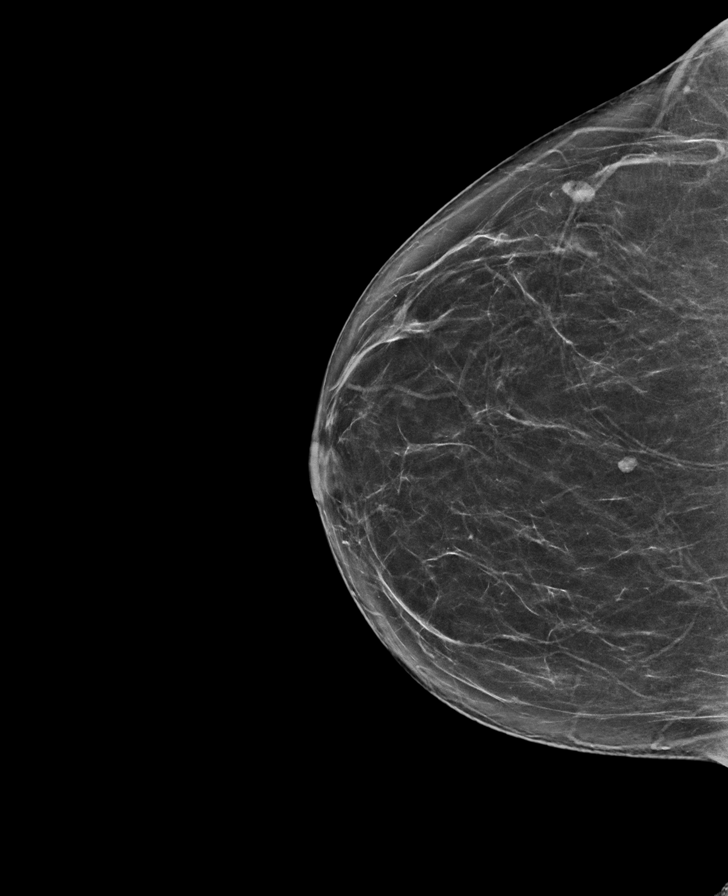

[R MLO synth-2D]
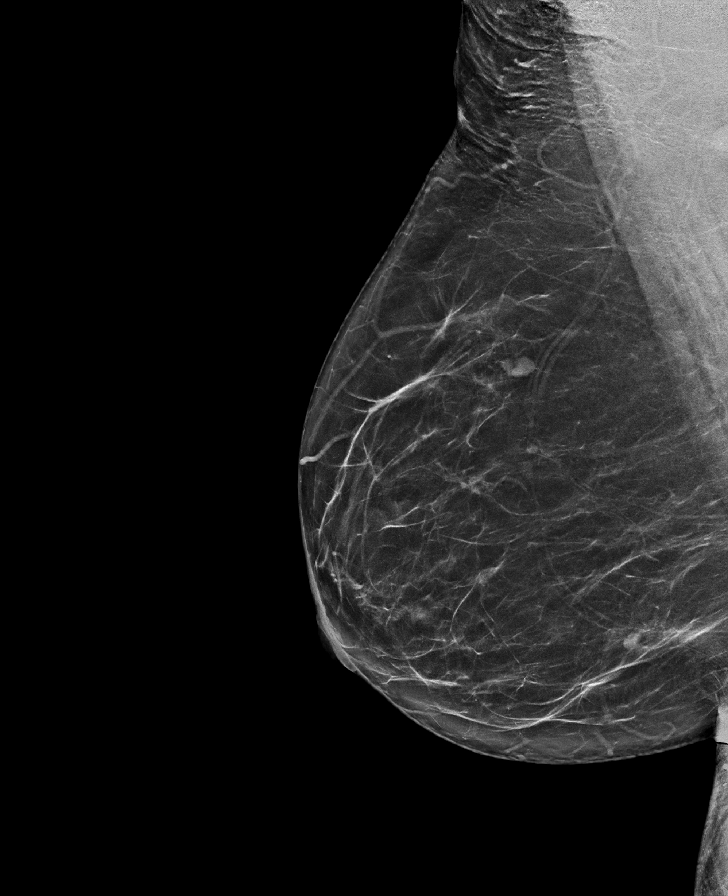

[L MLO synth-2D]
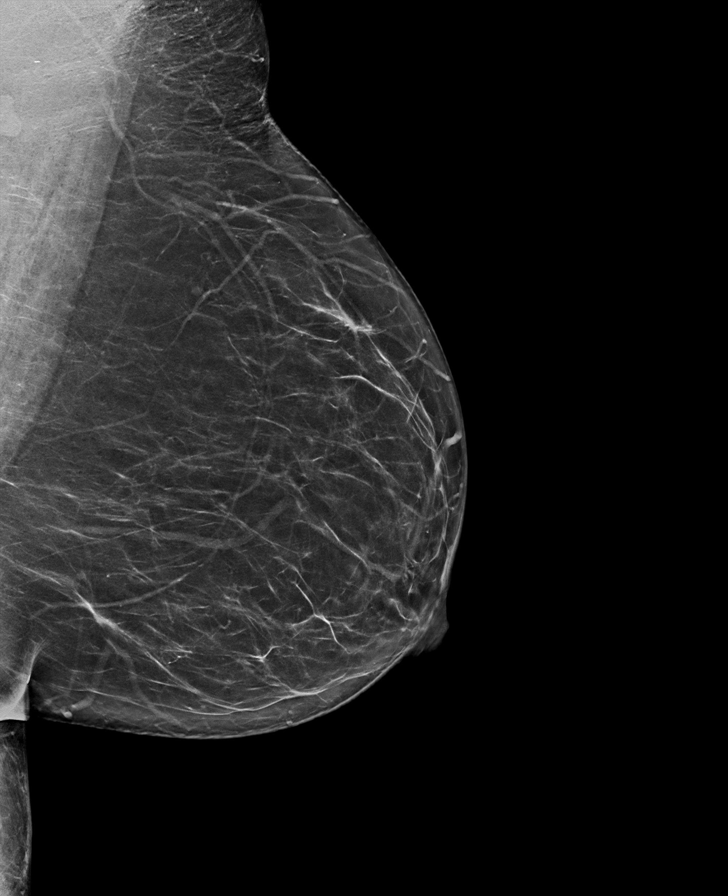

[L MLO tomo · tomo slice 41/81.0]
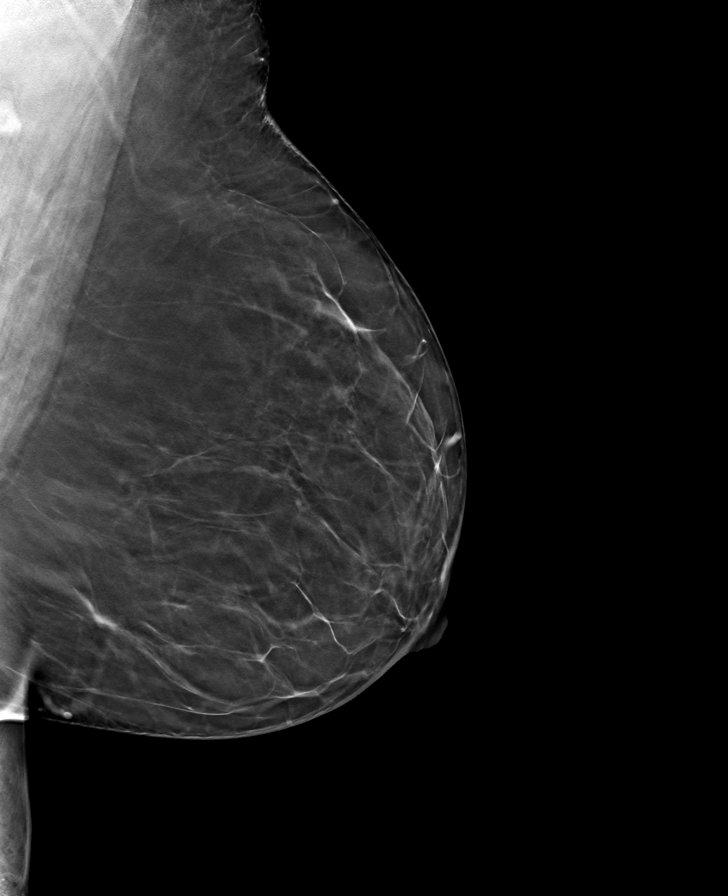

[R CC tomo · tomo slice 36/71.0]
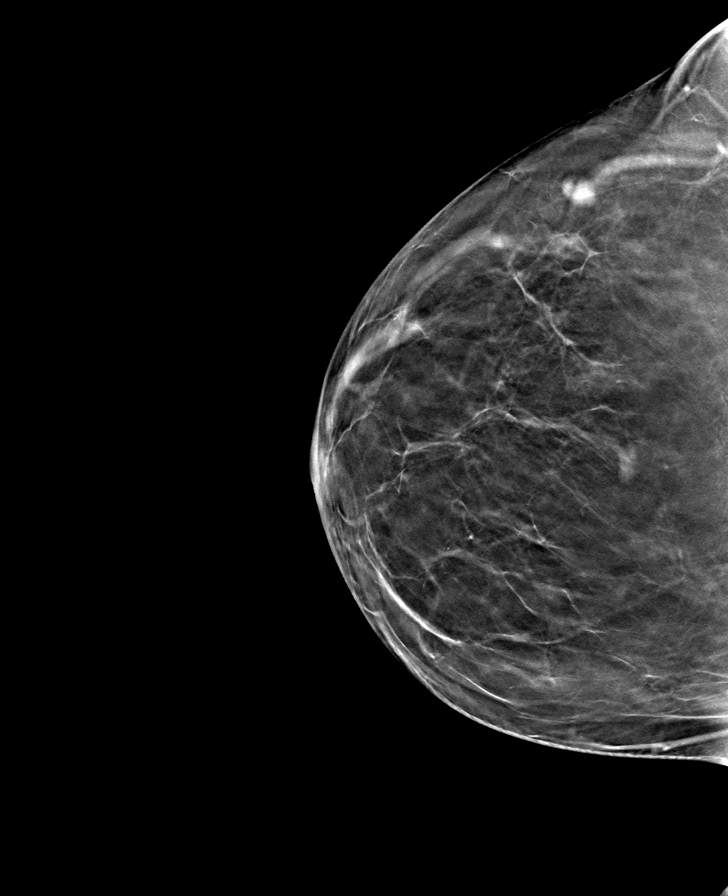

[R MLO tomo · tomo slice 40/79.0]
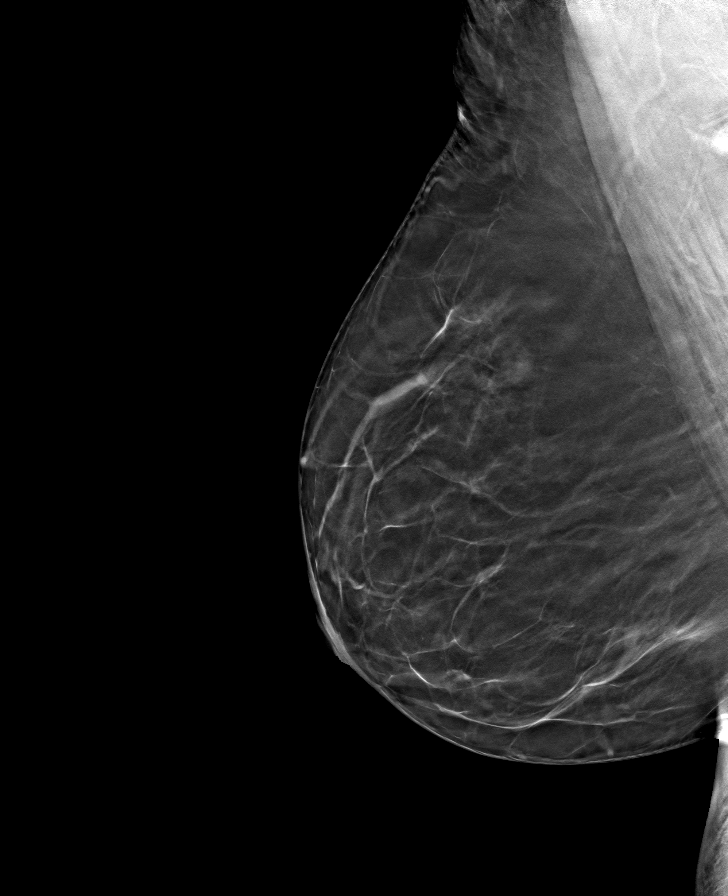

[L CC tomo · tomo slice 37/74.0]
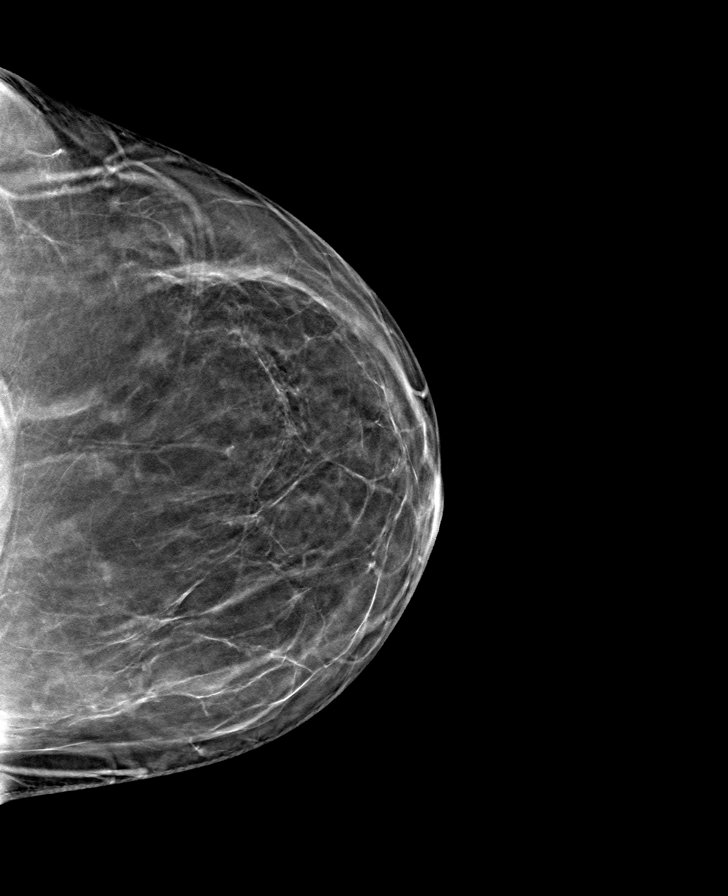

[8 of 24 positions shown; findings below may reference images not displayed]

ACR Breast Density Category b: There are scattered areas of
fibroglandular density.
FINDINGS: There are no findings suspicious for malignancy. The images were
evaluated with computer-aided detection.
IMPRESSION: No mammographic evidence of malignancy. A result letter of this
screening mammogram will be mailed directly to the patient.

RECOMMENDATION:
Screening mammogram in one year. (Code:[id number])

BI-RADS CATEGORY  1: Negative.

## 2022-09-20 ENCOUNTER — Ambulatory Visit (INDEPENDENT_AMBULATORY_CARE_PROVIDER_SITE_OTHER): Payer: BC Managed Care – PPO | Admitting: Physician Assistant

## 2022-09-20 ENCOUNTER — Encounter: Payer: Self-pay | Admitting: Physician Assistant

## 2022-09-20 VITALS — BP 120/65 | HR 75 | Temp 98.6°F | Resp 16 | Ht 65.0 in | Wt 225.8 lb

## 2022-09-20 DIAGNOSIS — Z0000 Encounter for general adult medical examination without abnormal findings: Secondary | ICD-10-CM

## 2022-09-20 DIAGNOSIS — R7303 Prediabetes: Secondary | ICD-10-CM

## 2022-09-20 DIAGNOSIS — Z23 Encounter for immunization: Secondary | ICD-10-CM | POA: Diagnosis not present

## 2022-09-20 DIAGNOSIS — E782 Mixed hyperlipidemia: Secondary | ICD-10-CM

## 2022-09-20 NOTE — Assessment & Plan Note (Signed)
Historically, will repeat fasting lipids The 10-year ASCVD risk score (Arnett DK, et al., 2019) is: 4.3%

## 2022-09-20 NOTE — Assessment & Plan Note (Signed)
Previous A1c 5.7% will order A1c today Continue metformin 500 mg daily  Discussed diet, exercise

## 2022-09-20 NOTE — Progress Notes (Signed)
I,Joseline E Rosas,acting as a scribe for [employer], PA-C.,have documented all relevant documentation on the behalf of Alfredia Ferguson, PA-C,as directed by  Alfredia Ferguson, PA-C while in the presence of Alfredia Ferguson, PA-C.   Complete physical exam   Patient: Jennifer Sawyer   DOB: 03-23-1961   61 y.o. Female  MRN: [id number] Visit Date: 09/20/2022  Today's healthcare provider: Alfredia Ferguson, PA-C   Chief Complaint  Patient presents with   Annual Exam   Subjective    Jennifer Sawyer is a 61 y.o. female who presents today for a complete physical exam.  She reports consuming a  well balanced  diet. The patient has a physically strenuous job, but has no regular exercise apart from work.  She generally feels well. She reports sleeping well. She does not have additional problems to discuss today.    History reviewed. No pertinent past medical history. Past Surgical History:  Procedure Laterality Date   ABDOMINAL HYSTERECTOMY     partial   CYSTECTOMY     ovarian   Social History   Socioeconomic History   Marital status: Single    Spouse name: Not on file   Number of children: 0   Years of education: associates   Highest education level: Not on file  Occupational History    Employer: [employer]  Tobacco Use   Smoking status: Never   Smokeless tobacco: Never  Substance and Sexual Activity   Alcohol use: No   Drug use: No   Sexual activity: Not Currently  Other Topics Concern   Not on file  Social History Narrative   Pt has niece who currently lives with pt.   Social Determinants of Health   Financial Resource Strain: Not on file  Food Insecurity: Not on file  Transportation Needs: Not on file  Physical Activity: Not on file  Stress: Not on file  Social Connections: Not on file  Intimate Partner Violence: Not on file   Family Status  Relation Name Status   Mother  Alive   Father  Alive   Brother  Alive   Brother  Alive   Family History   Problem Relation Age of Onset   Healthy Mother    Breast cancer Mother    Healthy Father    Healthy Brother    Healthy Brother    Allergies  Allergen Reactions   Codeine    Hydrocodone Swelling   Hydrocortisone Swelling    Patient Care Team: Alfredia Ferguson, PA-C as PCP - General (Physician Assistant)   Medications: Outpatient Medications Prior to Visit  Medication Sig   metFORMIN (GLUCOPHAGE) 500 MG tablet Take 1 tablet (500 mg total) by mouth daily with breakfast. For further refills please contact office and schedule follow up visit.   [DISCONTINUED] ipratropium (ATROVENT) 0.03 % nasal spray SMARTSIG:2 Spray(s) Both Nares 4 Times Daily PRN   [DISCONTINUED] terbinafine (LAMISIL) 250 MG tablet Take 1 tablet (250 mg total) by mouth daily.   No facility-administered medications prior to visit.    Review of Systems  All other systems reviewed and are negative.   Objective    BP 120/65 (BP Location: Left Arm, Patient Position: Sitting, Cuff Size: Large)   Pulse 75   Temp 98.6 F (37 C) (Oral)   Resp 16   Ht 5\' 5"  (1.651 m)   Wt 225 lb 12.8 oz (102.4 kg)   BMI 37.58 kg/m    Physical Exam Constitutional:      General: She  is awake.     Appearance: She is well-developed. She is not ill-appearing.  HENT:     Head: Normocephalic.     Right Ear: Tympanic membrane normal.     Left Ear: Tympanic membrane normal.     Nose: Nose normal. No congestion or rhinorrhea.     Mouth/Throat:     Pharynx: No oropharyngeal exudate or posterior oropharyngeal erythema.  Eyes:     Conjunctiva/sclera: Conjunctivae normal.     Pupils: Pupils are equal, round, and reactive to light.  Neck:     Thyroid: No thyroid mass or thyromegaly.  Cardiovascular:     Rate and Rhythm: Normal rate and regular rhythm.     Heart sounds: Normal heart sounds.  Pulmonary:     Effort: Pulmonary effort is normal.     Breath sounds: Normal breath sounds.  Abdominal:     Palpations: Abdomen is soft.      Tenderness: There is no abdominal tenderness.  Musculoskeletal:     Right lower leg: No swelling. No edema.     Left lower leg: No swelling. No edema.  Lymphadenopathy:     Cervical: No cervical adenopathy.  Skin:    General: Skin is warm.  Neurological:     Mental Status: She is alert and oriented to person, place, and time.  Psychiatric:        Attention and Perception: Attention normal.        Mood and Affect: Mood normal.        Speech: Speech normal.        Behavior: Behavior normal. Behavior is cooperative.      Last depression screening scores    09/20/2022   10:46 AM 01/09/2022    9:34 AM 06/23/2020    2:46 PM  PHQ 2/9 Scores  PHQ - 2 Score 0 0 0  PHQ- 9 Score 0 0 0   Last fall risk screening    09/20/2022   10:45 AM  Fall Risk   Falls in the past year? 0  Number falls in past yr: 0  Injury with Fall? 0  Risk for fall due to : No Fall Risks   Last Audit-C alcohol use screening    09/20/2022   10:46 AM  Alcohol Use Disorder Test (AUDIT)  1. How often do you have a drink containing alcohol? 0  2. How many drinks containing alcohol do you have on a typical day when you are drinking? 0  3. How often do you have six or more drinks on one occasion? 0  AUDIT-C Score 0   A score of 3 or more in women, and 4 or more in men indicates increased risk for alcohol abuse, EXCEPT if all of the points are from question 1   Results for orders placed or performed in visit on 09/20/22  HM PAP SMEAR  Result Value Ref Range   HM Pap smear Negative, HPV-Negative     Assessment & Plan    Routine Health Maintenance and Physical Exam  Exercise Activities and Dietary recommendations --balanced diet high in fiber and protein, low in sugars, carbs, fats. --physical activity/exercise 30 minutes 3-5 times a week    Immunization History  Administered Date(s) Administered   Hep A / Hep B 07/04/2020, 08/03/2020, 01/18/2021   Influenza-Unspecified 07/01/2015, 07/09/2016,  07/04/2020, 06/30/2022   Moderna Sars-Covid-2 Vaccination 12/23/2019, 01/26/2020, 09/26/2020, 09/27/2021   PPD Test 07/04/2020   Td 09/20/2022   Tdap 08/01/2011   Unspecified SARS-COV-2 Vaccination 07/13/2022  Zoster Recombinat (Shingrix) 08/03/2022    Health Maintenance  Topic Date Due   HIV Screening  Never done   Hepatitis C Screening  Never done   COVID-19 Vaccine (6 - 2023-24 season) 09/07/2022   Zoster Vaccines- Shingrix (2 of 2) 09/28/2022   MAMMOGRAM  04/06/2023   COLONOSCOPY (Pts 45-55yrs Insurance coverage will need to be confirmed)  05/08/2023   PAP SMEAR-Modifier  01/19/2024   DTaP/Tdap/Td (3 - Td or Tdap) 09/20/2032   HPV VACCINES  Aged Out   INFLUENZA VACCINE  Discontinued    Discussed health benefits of physical activity, and encouraged her to engage in regular exercise appropriate for her age and condition.  Problem List Items Addressed This Visit       Other   Prediabetes    Previous A1c 5.7% will order A1c today Continue metformin 500 mg daily  Discussed diet, exercise      Relevant Orders   Comprehensive metabolic panel   Hemoglobin A1c   CBC w/Diff/Platelet   Moderate mixed hyperlipidemia not requiring statin therapy    Historically, will repeat fasting lipids The 10-year ASCVD risk score (Arnett DK, et al., 2019) is: 4.3%       Relevant Orders   Comprehensive metabolic panel   Lipid panel   Other Visit Diagnoses     Annual physical exam    -  Primary   Need for Td vaccine       Relevant Orders   Td vaccine preservative free greater than or equal to 7yo IM (Completed)       Return in about 1 year (around 09/21/2023) for CPE.     I, Alfredia Ferguson, PA-C have reviewed all documentation for this visit. The documentation on  09/20/2022 for the exam, diagnosis, procedures, and orders are all accurate and complete.  Alfredia Ferguson, PA-C [facility] [address] [place], [place], [address] Office: [phone] Fax:  [phone]   [facility]

## 2022-09-21 LAB — COMPREHENSIVE METABOLIC PANEL
ALT: 11 IU/L (ref 0–32)
AST: 14 IU/L (ref 0–40)
Albumin/Globulin Ratio: 1.1 — ABNORMAL LOW (ref 1.2–2.2)
Albumin: 3.9 g/dL (ref 3.9–4.9)
Alkaline Phosphatase: 59 IU/L (ref 44–121)
BUN/Creatinine Ratio: 11 — ABNORMAL LOW (ref 12–28)
BUN: 10 mg/dL (ref 8–27)
Bilirubin Total: 0.5 mg/dL (ref 0.0–1.2)
CO2: 24 mmol/L (ref 20–29)
Calcium: 9.1 mg/dL (ref 8.7–10.3)
Chloride: 104 mmol/L (ref 96–106)
Creatinine, Ser: 0.91 mg/dL (ref 0.57–1.00)
Globulin, Total: 3.6 g/dL (ref 1.5–4.5)
Glucose: 89 mg/dL (ref 70–99)
Potassium: 4.4 mmol/L (ref 3.5–5.2)
Sodium: 141 mmol/L (ref 134–144)
Total Protein: 7.5 g/dL (ref 6.0–8.5)
eGFR: 72 mL/min/{1.73_m2} (ref >59)

## 2022-09-21 LAB — LIPID PANEL
Chol/HDL Ratio: 3.1 ratio (ref 0.0–4.4)
Cholesterol, Total: 148 mg/dL (ref 100–199)
HDL: 47 mg/dL (ref >39)
LDL Chol Calc (NIH): 88 mg/dL (ref 0–99)
Triglycerides: 62 mg/dL (ref 0–149)
VLDL Cholesterol Cal: 13 mg/dL (ref 5–40)

## 2022-09-21 LAB — CBC WITH DIFFERENTIAL/PLATELET
Basophils Absolute: 0.1 10*3/uL (ref 0.0–0.2)
Basos: 1 %
EOS (ABSOLUTE): 0.3 10*3/uL (ref 0.0–0.4)
Eos: 4 %
Hematocrit: 37 % (ref 34.0–46.6)
Hemoglobin: 11.9 g/dL (ref 11.1–15.9)
Immature Grans (Abs): 0 10*3/uL (ref 0.0–0.1)
Immature Granulocytes: 0 %
Lymphocytes Absolute: 2.3 10*3/uL (ref 0.7–3.1)
Lymphs: 33 %
MCH: 28.2 pg (ref 26.6–33.0)
MCHC: 32.2 g/dL (ref 31.5–35.7)
MCV: 88 fL (ref 79–97)
Monocytes Absolute: 0.6 10*3/uL (ref 0.1–0.9)
Monocytes: 8 %
Neutrophils Absolute: 3.7 10*3/uL (ref 1.4–7.0)
Neutrophils: 54 %
Platelets: 269 10*3/uL (ref 150–450)
RBC: 4.22 x10E6/uL (ref 3.77–5.28)
RDW: 14.6 % (ref 11.7–15.4)
WBC: 6.9 10*3/uL (ref 3.4–10.8)

## 2022-09-21 LAB — HEMOGLOBIN A1C
Est. average glucose Bld gHb Est-mCnc: 131 mg/dL
Hgb A1c MFr Bld: 6.2 % — ABNORMAL HIGH (ref 4.8–5.6)

## 2022-09-24 NOTE — Progress Notes (Signed)
Pre-diabetes is slightly increased; back at 6.2%. Continue to recommend balanced, lower carb meals. Smaller meal size, adding snacks. Choosing water as drink of choice and increasing purposeful exercise. Other labs remains stable and controlled. Covering for Alfredia Ferguson, PA-C Jacky Kindle, FNP  [facility] [address] [place], [place] [address] [phone] (phone) [phone] (fax) [facility]

## 2022-09-27 ENCOUNTER — Encounter: Payer: Self-pay | Admitting: Physician Assistant

## 2022-11-10 ENCOUNTER — Other Ambulatory Visit: Payer: Self-pay | Admitting: Physician Assistant

## 2022-11-10 DIAGNOSIS — R7303 Prediabetes: Secondary | ICD-10-CM

## 2022-12-26 NOTE — Progress Notes (Unsigned)
     I,J'ya E Hunter,acting as a scribe for [employer], PA-C.,have documented all relevant documentation on the behalf of [name], PA-C,as directed by  [name], PA-C while in the presence of [name], PA-C.   Established patient visit   Patient: Jennifer Sawyer   DOB: 1960/12/22   62 y.o. Female  MRN: [id number] Visit Date: 12/27/2022  Today's healthcare provider: [name], PA-C   No chief complaint on file.  Subjective    Leg Pain  The incident occurred more than 1 week ago (2 weeks ago). The pain is present in the left leg. The pain is at a severity of 5/10. The pain is moderate. Associated symptoms include numbness and tingling. Pertinent negatives include no inability to bear weight or muscle weakness. She reports no foreign bodies present. The symptoms are aggravated by movement. She has tried nothing for the symptoms. The treatment provided no relief.    Patient wants to discuss weight management.   Pain left buttock; goes down leg  Sciatica-- exercises. Contact if worsen can do steroids. Discussed diet at length. Diarrhea w/ metformin. Change to xr  Medications: Outpatient Medications Prior to Visit  Medication Sig   metFORMIN (GLUCOPHAGE) 500 MG tablet TAKE 1 TABLET BY MOUTH DAILY WITH BREAKFAST PLEASE CONTACT OFFICE & SCHEDULE FOLLOW UP FOR REFILLS   No facility-administered medications prior to visit.    Review of Systems  Neurological:  Positive for tingling and numbness.    {Labs  Heme  Chem  Endocrine  Serology  Results Review (optional):23779}   Objective    There were no vitals taken for this visit. {Show previous vital signs (optional):23777}  Physical Exam  ***  No results found for any visits on 12/27/22.  Assessment & Plan     ***  No follow-ups on file.      {provider attestation***:1}   [name], PA-C  [place] [phone] (phone) [phone] (fax)  [place]

## 2022-12-27 ENCOUNTER — Encounter: Payer: Self-pay | Admitting: Physician Assistant

## 2022-12-27 ENCOUNTER — Ambulatory Visit: Payer: BC Managed Care – PPO | Admitting: Physician Assistant

## 2022-12-27 VITALS — BP 126/72 | HR 71 | Temp 98.0°F | Resp 12 | Ht 65.0 in | Wt 222.3 lb

## 2022-12-27 DIAGNOSIS — M5432 Sciatica, left side: Secondary | ICD-10-CM

## 2022-12-27 DIAGNOSIS — R7303 Prediabetes: Secondary | ICD-10-CM | POA: Diagnosis not present

## 2022-12-27 MED ORDER — METFORMIN HCL ER 500 MG PO TB24: 500.0000 mg | ORAL_TABLET | Freq: Every day | ORAL | 1 refills | Status: DC

## 2022-12-30 ENCOUNTER — Encounter: Payer: Self-pay | Admitting: Physician Assistant

## 2022-12-30 NOTE — Assessment & Plan Note (Signed)
Discussed weight management/diet/exercise at length Pt states she has had prior nutritionist visits. Trial of changing instant metformin to XR.

## 2023-02-06 ENCOUNTER — Ambulatory Visit: Payer: Self-pay | Admitting: *Deleted

## 2023-02-06 ENCOUNTER — Other Ambulatory Visit: Payer: Self-pay | Admitting: Physician Assistant

## 2023-02-06 DIAGNOSIS — M5432 Sciatica, left side: Secondary | ICD-10-CM

## 2023-02-06 MED ORDER — METHYLPREDNISOLONE 4 MG PO TBPK: ORAL_TABLET | ORAL | 0 refills | Status: DC

## 2023-02-06 NOTE — Telephone Encounter (Signed)
Summary: pain relief   Patient said she can't sleep at night as she has really bad lower back pain that radiates down the back of her left leg. She is asking that provider send something in for the pain          Chief Complaint: low back pain persists since 12/27/22 OV requesting if pain medication can be prescribed Symptoms: left low back pain radiates to left buttocks, down left leg, difficulty sleeping at night. Able to walk and work during the day but when sitting or laying pain increases. Has been managing pain with aleve but aleve not effective anymore Frequency: since last OV 12/27/22 Pertinent Negatives: Patient denies N/T left leg. No issues with B/B.  Disposition: [] ED /[] Urgent Care (no appt availability in office) / [] Appointment(In office/virtual)/ []  [place] [facility]/ [] Home Care/ [] Refused Recommended Disposition /[] [place] [facility]/ [x]  Follow-up with PCP Additional Notes:   Requesting pain medication . Last seen for same sx 12/27/22. Please advise if another OV needed. Patient works during day and requesting not to have to make another appt if not needed.         Reason for Disposition  Back pain is a chronic symptom (recurrent or ongoing AND present > 4 weeks)  Answer Assessment - Initial Assessment Questions 1. ONSET: "When did the pain begin?"      On going since 12/27/22 OV  2. LOCATION: "Where does it hurt?" (upper, mid or lower back)     Low back left side hip, buttocks, radiates down left leg  3. SEVERITY: "How bad is the pain?"  (e.g., Scale 1-10; mild, moderate, or severe)   - MILD (1-3): Doesn't interfere with normal activities.    - MODERATE (4-7): Interferes with normal activities or awakens from sleep.    - SEVERE (8-10): Excruciating pain, unable to do any normal activities.      Moderate. Can not sleep  4. PATTERN: "Is the pain constant?" (e.g., yes, no; constant, intermittent)      Intermittent  5. RADIATION: "Does the pain shoot into  your legs or somewhere else?"     Left leg 6. CAUSE:  "What do you think is causing the back pain?"      Not sure was told inflammation 7. BACK OVERUSE:  "Any recent lifting of heavy objects, strenuous work or exercise?"     Walks for job  8. MEDICINES: "What have you taken so far for the pain?" (e.g., nothing, acetaminophen, NSAIDS)     Aleve 1 during the day and 1 at night. Not as effective for pain anymore 9. NEUROLOGIC SYMPTOMS: "Do you have any weakness, numbness, or problems with bowel/bladder control?"     Denies  10. OTHER SYMPTOMS: "Do you have any other symptoms?" (e.g., fever, abdomen pain, burning with urination, blood in urine)       Low back left side down left leg 11. PREGNANCY: "Is there any chance you are pregnant?" "When was your last menstrual period?"       na  Protocols used: Back Pain-A-AH

## 2023-02-07 NOTE — Telephone Encounter (Signed)
[facility]-regarding this message.

## 2023-03-28 ENCOUNTER — Ambulatory Visit: Payer: BC Managed Care – PPO | Admitting: Family Medicine

## 2023-03-28 ENCOUNTER — Encounter: Payer: Self-pay | Admitting: Family Medicine

## 2023-03-28 VITALS — BP 114/69 | HR 83 | Temp 98.5°F | Ht 65.0 in | Wt 224.0 lb

## 2023-03-28 DIAGNOSIS — M25511 Pain in right shoulder: Secondary | ICD-10-CM | POA: Diagnosis not present

## 2023-03-28 DIAGNOSIS — M7541 Impingement syndrome of right shoulder: Secondary | ICD-10-CM

## 2023-03-28 DIAGNOSIS — Z114 Encounter for screening for human immunodeficiency virus [HIV]: Secondary | ICD-10-CM | POA: Diagnosis not present

## 2023-03-28 DIAGNOSIS — G8929 Other chronic pain: Secondary | ICD-10-CM

## 2023-03-28 DIAGNOSIS — Z1159 Encounter for screening for other viral diseases: Secondary | ICD-10-CM | POA: Diagnosis not present

## 2023-03-28 DIAGNOSIS — R7303 Prediabetes: Secondary | ICD-10-CM

## 2023-03-28 MED ORDER — CYCLOBENZAPRINE HCL 5 MG PO TABS: ORAL_TABLET | ORAL | 0 refills | Status: DC

## 2023-03-28 NOTE — Patient Instructions (Signed)
Take TWO 220 mg aleve twice daily for 5 days

## 2023-03-28 NOTE — Progress Notes (Signed)
Established patient visit   Patient: Jennifer Sawyer   DOB: 1960-11-27   63 y.o. Female  MRN: [id number] Visit Date: 03/28/2023  Today's healthcare provider: Sherlyn Hay, DO   Chief Complaint  Patient presents with   Shoulder Pain    Patient presents with complaint of right shoulder pain going on for 2-3 months.  She has had no known trauma and thinks this may be coming from repetitive movement.  She does a lot of mopping at work.  Worse with certain movements.  She has been using OTC Aleve with little relief.   Arm Pain    Patient also states that at times she feels the pain go down her right arm and sometimes all the way to her fingers.  She reports that at times picking things up are hard.   Subjective    HPI HPI     Shoulder Pain    Additional comments: Patient presents with complaint of right shoulder pain going on for 2-3 months.  She has had no known trauma and thinks this may be coming from repetitive movement.  She does a lot of mopping at work.  Worse with certain movements.  She has been using OTC Aleve with little relief.        Arm Pain    Additional comments: Patient also states that at times she feels the pain go down her right arm and sometimes all the way to her fingers.  She reports that at times picking things up are hard.      Last edited by Adline Peals, CMA on 03/28/2023  2:35 PM.      Patient is having intermittent right shoulder pain for several months.   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    -  school is out, so she is only doing the latter job  currently.  Fam hx OA  Taking Aleve with minimal relief Did try biofreeze - some help   Medications: Outpatient Medications Prior to Visit  Medication Sig   metFORMIN (GLUCOPHAGE-XR) 500 MG 24 hr tablet Take 1 tablet (500 mg total) by mouth daily with breakfast.   [DISCONTINUED] methylPREDNISolone (MEDROL DOSEPAK) 4 MG TBPK tablet In the morning, take 6 pills on day 1, 5 pills on day 2, 4 pills on day 3, 3 pills on day 4, 2 pills on day 5, and 1 pill on day 6   No facility-administered medications prior to visit.    Review of Systems  Constitutional:  Negative for appetite change, chills, fatigue and fever.  Respiratory:  Negative for chest tightness and shortness of breath.   Cardiovascular:  Negative for chest pain and palpitations.  Gastrointestinal:  Negative for abdominal pain, nausea and vomiting.  Musculoskeletal:  Positive for arthralgias (shoulder) and myalgias.  Skin:  Negative for color change, pallor and rash.  Neurological:  Negative for dizziness and weakness.  Hematological:  Does not bruise/bleed easily.       Objective    BP 114/69 (BP Location: Right Arm, Patient Position: Sitting, Cuff Size: Large)  Pulse 83   Temp 98.5 F (36.9 C) (Oral)   Ht 5\' 5"  (1.651 m)   Wt 224 lb (101.6 kg)   SpO2 98%   BMI 37.28 kg/m    Physical Exam Vitals and nursing note reviewed.  Constitutional:      General: She is not in acute distress.    Appearance: Normal appearance.  HENT:     Head: Normocephalic and atraumatic.  Eyes:     General: No scleral icterus.    Conjunctiva/sclera: Conjunctivae normal.  Cardiovascular:     Rate and Rhythm: Normal rate.  Pulmonary:     Effort: Pulmonary effort is normal.  Musculoskeletal:        General: Tenderness present. No swelling or deformity.     Right shoulder: Tenderness present. Decreased range of motion.     Right lower leg: No edema.     Left lower leg: No edema.     Comments: + empty can, Hawkins-Kennedy, Neer's  and lift-off tests on right side.  Neurological:     Mental Status: She is alert and oriented to person, place, and time. Mental status is at baseline.  Psychiatric:        Mood and Affect: Mood normal.        Behavior: Behavior normal.      Results for orders placed or performed in visit on 03/28/23  Hemoglobin A1c  Result Value Ref Range   Hgb A1c MFr Bld 6.2 (H) 4.8 - 5.6 %   Est. average glucose Bld gHb Est-mCnc 131 mg/dL  Basic metabolic panel  Result Value Ref Range   Glucose 89 70 - 99 mg/dL   BUN 13 8 - 27 mg/dL   Creatinine, Ser 4.09 0.57 - 1.00 mg/dL   eGFR 95 >81 XB/JYN/8.29   BUN/Creatinine Ratio 18 12 - 28   Sodium 141 134 - 144 mmol/L   Potassium 4.3 3.5 - 5.2 mmol/L   Chloride 105 96 - 106 mmol/L   CO2 21 20 - 29 mmol/L   Calcium 9.1 8.7 - 10.3 mg/dL  HCV Ab w Reflex to Quant PCR  Result Value Ref Range   HCV Ab Non Reactive Non Reactive  HIV Antibody (routine testing w rflx)  Result Value Ref Range   HIV Screen 4th Generation wRfx Non Reactive Non Reactive  Interpretation:  Result Value Ref Range   HCV Interp 1: Comment     Assessment & Plan    1. Chronic right shoulder pain I strongly suspect the patient's injury stems from chronic overuse.  Advised patient to take two 220 mg Aleve twice a day for 5 days.  Also prescribed cyclobenzaprine as noted below to facilitate muscle relaxation/sleep.    - Gave patient multiple printed handout for exercises she can do to stretch and strengthen her rotator cuff (specifically her supraspinatus and subscapularis, as well as her biceps brachii tendon); encouraged her to do these exercises at least twice daily.    - Discussed the option of physical therapy if these methods do not help improve her shoulder pain.    - Gave patient a note requesting light duty for work for the next couple of weeks.    - Discussed that she can use heat or ice to the area as desired. - cyclobenzaprine (FLEXERIL) 5 MG tablet; Take 1-2 tablets  each night at bedtime as needed for pain/muscle spasm.  Dispense: 14 tablet; Refill: 0  2. Prediabetes Patient has history of prediabetes.  Will check her labs as  noted below as she is overdue. - Hemoglobin A1c - Basic metabolic panel  3. Encounter for hepatitis C screening test for low risk patient Will perform hepatitis C screening as noted below. - HCV Ab w Reflex to Quant PCR  4. Encounter for screening for HIV Will perform HIV screening as noted below. - HIV Antibody (routine testing w rflx)  5. Subacromial impingement of right shoulder Addressed as above.    Return in about 2 weeks (around 04/11/2023), or if symptoms worsen or fail to improve, for recheck shoulder.      Total time was 40 minutes. That includes chart review before the visit, the actual patient visit, and time spent on documentation after the visit.   The entirety of the information documented in the History of Present Illness, Review of Systems and Physical Exam were personally obtained by me. Portions of this information were initially documented by the CMA, Adline Peals, and reviewed by me for thoroughness and accuracy.   I discussed the assessment and treatment plan with the patient  The patient was provided an opportunity to ask questions and all were answered. The patient agreed with the plan and demonstrated an understanding of the instructions.   The patient was advised to call back or seek an in-person evaluation if the symptoms worsen or if the condition fails to improve as anticipated.    Sherlyn Hay, DO  [facility] [phone] (phone) [phone] (fax)  [facility]

## 2023-03-29 LAB — BASIC METABOLIC PANEL
BUN/Creatinine Ratio: 18 (ref 12–28)
BUN: 13 mg/dL (ref 8–27)
CO2: 21 mmol/L (ref 20–29)
Calcium: 9.1 mg/dL (ref 8.7–10.3)
Chloride: 105 mmol/L (ref 96–106)
Creatinine, Ser: 0.72 mg/dL (ref 0.57–1.00)
Glucose: 89 mg/dL (ref 70–99)
Potassium: 4.3 mmol/L (ref 3.5–5.2)
Sodium: 141 mmol/L (ref 134–144)
eGFR: 95 mL/min/{1.73_m2} (ref >59)

## 2023-03-29 LAB — HEMOGLOBIN A1C
Est. average glucose Bld gHb Est-mCnc: 131 mg/dL
Hgb A1c MFr Bld: 6.2 % — ABNORMAL HIGH (ref 4.8–5.6)

## 2023-03-29 LAB — HCV INTERPRETATION

## 2023-03-29 LAB — HCV AB W REFLEX TO QUANT PCR: HCV Ab: NONREACTIVE

## 2023-03-29 LAB — HIV ANTIBODY (ROUTINE TESTING W REFLEX): HIV Screen 4th Generation wRfx: NONREACTIVE

## 2023-04-11 ENCOUNTER — Encounter: Payer: Self-pay | Admitting: Family Medicine

## 2023-04-11 ENCOUNTER — Ambulatory Visit: Payer: BC Managed Care – PPO | Admitting: Family Medicine

## 2023-04-11 VITALS — BP 124/75 | HR 90 | Ht 65.0 in | Wt 226.0 lb

## 2023-04-11 DIAGNOSIS — Z9889 Other specified postprocedural states: Secondary | ICD-10-CM

## 2023-04-11 DIAGNOSIS — Z9289 Personal history of other medical treatment: Secondary | ICD-10-CM | POA: Diagnosis not present

## 2023-04-11 DIAGNOSIS — G8929 Other chronic pain: Secondary | ICD-10-CM

## 2023-04-11 DIAGNOSIS — M25511 Pain in right shoulder: Secondary | ICD-10-CM | POA: Diagnosis not present

## 2023-04-11 NOTE — Patient Instructions (Signed)
Schedule colonoscopy & mammogram

## 2023-04-11 NOTE — Progress Notes (Signed)
Established patient visit   Patient: Jennifer Sawyer   DOB: December 13, 1960   62 y.o. Female  MRN: [id number] Visit Date: 04/11/2023  Today's healthcare provider: Sherlyn Hay, DO   Chief Complaint  Patient presents with   Follow-up    Pt stated--right shoulder doing much better but still little have discomfort especially when mapping the floor.   Subjective    HPI HPI     Follow-up    Additional comments: Pt stated--right shoulder doing much better but still little have discomfort especially when mapping the floor.      Last edited by Shelly Bombard, CMA on 04/11/2023 10:06 AM.      Her right shoulder pain is significantly improved.  She is no longer experiencing shooting pains.  She continues to deny numbness/tingling.  - At work: Light mopping, taking more breaks. She states that her work has been very accommodating and understanding.  - Doing exercises twice a day   - Took the naproxen as directed and is taking cyclobenzaprine (took one tablet every two days).  When calling her initial pain level a 10/10, she states she is down from a 10 to a 5 in pain.  Due for colonocsopy - she will be calling to schedule this Mammogram due - these are regularly scheduled by her OB/GYN   Medications: Outpatient Medications Prior to Visit  Medication Sig   cyclobenzaprine (FLEXERIL) 5 MG tablet Take 1-2 tablets each night at bedtime as needed for pain/muscle spasm.   metFORMIN (GLUCOPHAGE-XR) 500 MG 24 hr tablet Take 1 tablet (500 mg total) by mouth daily with breakfast.   No facility-administered medications prior to visit.    Review of Systems  Constitutional:  Negative for appetite change, chills, fatigue and fever.  Respiratory:  Negative for chest tightness and shortness of breath.   Cardiovascular:  Negative for chest pain and palpitations.  Gastrointestinal:  Negative for abdominal pain, nausea and vomiting.  Musculoskeletal:  Positive for arthralgias (Significantly  improved).  Neurological:  Negative for dizziness, weakness and numbness.       Objective    BP 124/75 (BP Location: Right Arm, Patient Position: Sitting, Cuff Size: Large)   Pulse 90   Ht 5\' 5"  (1.651 m)   Wt 226 lb (102.5 kg)   SpO2 98%   BMI 37.61 kg/m    Physical Exam Vitals and nursing note reviewed.  Constitutional:      General: She is not in acute distress.    Appearance: Normal appearance.  HENT:     Head: Normocephalic and atraumatic.  Eyes:     General: No scleral icterus.    Conjunctiva/sclera: Conjunctivae normal.  Cardiovascular:     Rate and Rhythm: Normal rate.  Pulmonary:     Effort: Pulmonary effort is normal.  Musculoskeletal:     Right shoulder: Tenderness (Significantly improved) present. Normal range of motion.     Left shoulder: Normal range of motion.  Neurological:     Mental Status: She is alert and oriented to person, place, and time. Mental status is at baseline.  Psychiatric:        Mood and Affect: Mood normal.        Behavior: Behavior normal.      No results found for any visits on 04/11/23.  Assessment & Plan    1. Chronic right shoulder pain Patient is doing significantly better today.  She will continue with light duty at work and her twice daily stretching  and strengthening exercises.  Discussed with her that we can consider another course of OTC naproxen twice daily for 5 days if she feels that she will derive benefit from this.  However, she has continued to improve with exercises and light duty alone, so I will leave this to her discretion.  She has approximately 5 tablets of cyclobenzaprine left and will continue to use these as needed.  She will then see how she is doing when she has no more; advised her that I will send in more cyclobenzaprine if needed (patient to contact office) to facilitate continued improvement, given her judicious use of it.  2. History of colonoscopy Patient will be due for colonoscopy next month.   Advised her of this; she will be calling to schedule.  3. History of screening mammography Patient is slightly overdue for her routine screening mammography.  She believes she has an appointment with her OB/GYN soon, where it is normally scheduled for her.   Return in about 5 months (around 09/01/2023) for CPE.       I discussed the assessment and treatment plan with the patient  The patient was provided an opportunity to ask questions and all were answered. The patient agreed with the plan and demonstrated an understanding of the instructions.   The patient was advised to call back or seek an in-person evaluation if the symptoms worsen or if the condition fails to improve as anticipated.    [facility] [phone] (phone) [phone] (fax)  [facility]

## 2023-04-14 ENCOUNTER — Other Ambulatory Visit: Payer: Self-pay | Admitting: Obstetrics and Gynecology

## 2023-04-14 DIAGNOSIS — Z1231 Encounter for screening mammogram for malignant neoplasm of breast: Secondary | ICD-10-CM

## 2023-04-18 ENCOUNTER — Ambulatory Visit: Payer: Self-pay

## 2023-04-18 NOTE — Telephone Encounter (Signed)
Chief Complaint: Information only   Disposition: [] ED /[] Urgent Care (no appt availability in office) / [] Appointment(In office/virtual)/ []  [place] Virtual Care/ [] Home Care/ [] Refused Recommended Disposition /[] [place] Mobile [facility]/ [x]  Follow-up with PCP Additional Notes: Patient stated that at her last office visit with her provider she was asked about her Hepatitis B vaccine status and she was unsure. She has her records from the Health department and stated she received 3 vaccines for Hepatitis B on the following dates 07/04/20, 08/03/20, and 01/18/21. She also stated that she went to give blood 3 weeks ago but could not because her Hemoglobin level was 11.2 and it was the same today when she tried ago. She stated she was told that her Hemoglobin as to be at least a 12 to donate blood. Advised patient I would forward information to provider.   Pt is calling in with questions about her lab results.   Reason for Disposition  General information question, no triage required and triager able to answer question  Answer Assessment - Initial Assessment Questions 1. REASON FOR CALL or QUESTION: "What is your reason for calling today?" or "How can I best help you?" or "What question do you have that I can help answer?"     Patient wanted to let provider know her Hep B vaccine status and her recent Hemoglobin numbers.  Protocols used: Information Only Call - No Triage-A-AH

## 2023-04-22 ENCOUNTER — Encounter: Payer: Self-pay | Admitting: Family Medicine

## 2023-04-25 ENCOUNTER — Ambulatory Visit: Admission: RE | Admit: 2023-04-25 | Payer: BC Managed Care – PPO | Source: Ambulatory Visit

## 2023-04-25 DIAGNOSIS — Z1231 Encounter for screening mammogram for malignant neoplasm of breast: Secondary | ICD-10-CM | POA: Diagnosis present

## 2023-06-22 ENCOUNTER — Other Ambulatory Visit: Payer: Self-pay | Admitting: Physician Assistant

## 2023-06-22 DIAGNOSIS — R7303 Prediabetes: Secondary | ICD-10-CM

## 2023-06-24 NOTE — Telephone Encounter (Signed)
Requested Prescriptions  Pending Prescriptions Disp Refills   metFORMIN (GLUCOPHAGE-XR) 500 MG 24 hr tablet [Pharmacy Med Name: METFORMIN HCL ER 500 MG TABLET] 90 tablet 0    Sig: TAKE 1 TABLET BY MOUTH EVERY DAY WITH BREAKFAST     Endocrinology:  Diabetes - Biguanides Failed - 06/22/2023  8:46 AM      Failed - B12 Level in normal range and within 720 days    No results found for: "VITAMINB12"       Passed - Cr in normal range and within 360 days    Creat  Date Value Ref Range Status  08/22/2017 0.90 0.50 - 1.05 mg/dL Final    Comment:    For patients >33 years of age, the reference limit for Creatinine is approximately 13% higher for people identified as African-American. .    Creatinine, Ser  Date Value Ref Range Status  03/28/2023 0.72 0.57 - 1.00 mg/dL Final         Passed - HBA1C is between 0 and 7.9 and within 180 days    Hgb A1c MFr Bld  Date Value Ref Range Status  03/28/2023 6.2 (H) 4.8 - 5.6 % Final    Comment:             Prediabetes: 5.7 - 6.4          Diabetes: >6.4          Glycemic control for adults with diabetes: <7.0          Passed - eGFR in normal range and within 360 days    GFR, Est African American  Date Value Ref Range Status  08/22/2017 83 > OR = 60 mL/min/1.6m2 Final   GFR calc Af Amer  Date Value Ref Range Status  08/25/2018 78 >59 mL/min/1.73 Final   GFR, Est Non African American  Date Value Ref Range Status  08/22/2017 72 > OR = 60 mL/min/1.35m2 Final   GFR calc non Af Amer  Date Value Ref Range Status  08/25/2018 68 >59 mL/min/1.73 Final   eGFR  Date Value Ref Range Status  03/28/2023 95 >59 mL/min/1.73 Final         Passed - Valid encounter within last 6 months    Recent Outpatient Visits           2 months ago Chronic right shoulder pain   [facility] [place], Sarah N, DO   2 months ago Chronic right shoulder pain   [facility] [place], Monico Blitz, DO   5 months ago Left  sided sciatica   [facility] Alfredia Ferguson, PA-C   9 months ago Annual physical exam   [facility] Alfredia Ferguson, PA-C   1 year ago Prediabetes    [facility] [place], [place], PA-C              Passed - CBC within normal limits and completed in the last 12 months    WBC  Date Value Ref Range Status  09/20/2022 6.9 3.4 - 10.8 x10E3/uL Final   RBC  Date Value Ref Range Status  09/20/2022 4.22 3.77 - 5.28 x10E6/uL Final   Hemoglobin  Date Value Ref Range Status  09/20/2022 11.9 11.1 - 15.9 g/dL Final   Hematocrit  Date Value Ref Range Status  09/20/2022 37.0 34.0 - 46.6 % Final   MCHC  Date Value Ref Range Status  09/20/2022 32.2 31.5 - 35.7 g/dL Final  [facility]  Date Value Ref Range Status  09/20/2022 28.2 26.6 - 33.0 pg Final   MCV  Date Value Ref Range Status  09/20/2022 88 79 - 97 fL Final   No results found for: "PLTCOUNTKUC", "LABPLAT", "POCPLA" RDW  Date Value Ref Range Status  09/20/2022 14.6 11.7 - 15.4 % Final

## 2023-09-08 ENCOUNTER — Telehealth (INDEPENDENT_AMBULATORY_CARE_PROVIDER_SITE_OTHER): Payer: BC Managed Care – PPO | Admitting: Family Medicine

## 2023-09-08 ENCOUNTER — Encounter: Payer: Self-pay | Admitting: Family Medicine

## 2023-09-08 DIAGNOSIS — J40 Bronchitis, not specified as acute or chronic: Secondary | ICD-10-CM

## 2023-09-08 DIAGNOSIS — J209 Acute bronchitis, unspecified: Secondary | ICD-10-CM

## 2023-09-08 MED ORDER — ALBUTEROL SULFATE HFA 108 (90 BASE) MCG/ACT IN AERS
2.0000 | INHALATION_SPRAY | Freq: Four times a day (QID) | RESPIRATORY_TRACT | 0 refills | Status: DC | PRN
Start: 2023-09-08 — End: 2023-09-29

## 2023-09-08 MED ORDER — BENZONATATE 200 MG PO CAPS
200.0000 mg | ORAL_CAPSULE | Freq: Two times a day (BID) | ORAL | 0 refills | Status: DC | PRN
Start: 2023-09-08 — End: 2023-09-29

## 2023-09-08 NOTE — Progress Notes (Signed)
MyChart Video Visit    Virtual Visit via Video Note   This format is felt to be most appropriate for this patient at this time. Physical exam was limited by quality of the video and audio technology used for the visit.   Patient location: Patient's home address   Provider location: [facility]  [address]  [place], [place] [address]   I discussed the limitations of evaluation and management by telemedicine and the availability of in person appointments. The patient expressed understanding and agreed to proceed.  Patient: Jennifer Sawyer   DOB: Jan 24, 1961   62 y.o. Female  MRN: [id number] Visit Date: 09/08/2023  Today's healthcare provider: Ronnald Ramp, MD   No chief complaint on file.  Subjective    HPI   Discussed the use of AI scribe software for clinical note transcription with the patient, who gave verbal consent to proceed.  History of Present Illness   The patient, a 62 year old individual with a history of obesity, prediabetes, and mixed hyperlipidemia, presents with a cough thought to be due to a viral process. The symptoms began with a sore throat, which progressed to a loss of voice for three to four days. The patient describes the voice loss as more than hoarseness, to the point of barely being able to whisper. Currently, the patient reports a dry, hacking cough and mild rhinorrhea, with postnasal drip suspected. The cough has led to throat irritation and noticeable wheezing, but no shortness of breath. The patient denies hemoptysis.  The patient has been managing the symptoms with over-the-counter medications, including Robitussin DM, Mucinex DM, and Halls cough drops. The patient reports some relief from these medications, particularly the Mucinex DM, but the cough persists, especially when the effects of the medication wear off. The patient also had a recent bacterial eye infection, which has since resolved with steroid eye  drops.  The patient's current medication regimen includes metformin, with no recent changes. The patient has a past prescription for Flexeril, which was used for sciatica several months ago but is no longer needed. The patient also recalls a past prescription for [facility], which was effective for a previous cough.          History reviewed. No pertinent past medical history.  Medications: Outpatient Medications Prior to Visit  Medication Sig  . metFORMIN (GLUCOPHAGE-XR) 500 MG 24 hr tablet TAKE 1 TABLET BY MOUTH EVERY DAY WITH BREAKFAST  . [DISCONTINUED] cyclobenzaprine (FLEXERIL) 5 MG tablet Take 1-2 tablets each night at bedtime as needed for pain/muscle spasm.   No facility-administered medications prior to visit.    Review of Systems  Last metabolic panel Lab Results  Component Value Date   GLUCOSE 89 03/28/2023   NA 141 03/28/2023   K 4.3 03/28/2023   CL 105 03/28/2023   CO2 21 03/28/2023   BUN 13 03/28/2023   CREATININE 0.72 03/28/2023   EGFR 95 03/28/2023   CALCIUM 9.1 03/28/2023   PROT 7.5 09/20/2022   ALBUMIN 3.9 09/20/2022   LABGLOB 3.6 09/20/2022   AGRATIO 1.1 (L) 09/20/2022   BILITOT 0.5 09/20/2022   ALKPHOS 59 09/20/2022   AST 14 09/20/2022   ALT 11 09/20/2022        Objective    There were no vitals taken for this visit.  BP Readings from Last 3 Encounters:  04/11/23 124/75  03/28/23 114/69  12/27/22 126/72   Wt Readings from Last 3 Encounters:  04/11/23 226 lb (102.5 kg)  03/28/23 224 lb (101.6 kg)  12/27/22 222 lb 4.8 oz (100.8 kg)        Physical Exam Constitutional:      General: She is not in acute distress.    Appearance: She is ill-appearing. She is not toxic-appearing.  Pulmonary:     Effort: Pulmonary effort is normal.     Comments: Coughing throughout video call  Normal respiratory effort on room air       Assessment & Plan     Problem List Items Addressed This Visit   None Visit Diagnoses     Bronchitis     -  Primary   Relevant Medications   benzonatate (TESSALON) 200 MG capsule   albuterol (VENTOLIN HFA) 108 (90 Base) MCG/ACT inhaler         Acute Bronchitis Presents with a dry, hacking cough, postnasal drip, and wheezing, which started with a sore throat and laryngitis approximately three days ago. Symptoms consistent with acute bronchitis, likely viral etiology. Reports some relief with over-the-counter medications but continues to experience significant symptoms. Discussed risks of untreated bronchitis including prolonged cough and potential for secondary infections. Benefits of prescribed medications include symptom relief and improved breathing. Alternatives include continued use of over-the-counter medications, though less effective. - Prescribe Tessalon Perles 200 mg, take twice a day as needed for cough - Prescribe albuterol inhaler, use every four hours as needed for wheezing - Recommend OTC cetirizine 10 mg once daily for postnasal drip - Advise to seek emergency care if experiencing lightheadedness, dizziness, or difficulty breathing - Follow up if symptoms persist or worsen in 10 days Advised on supportive care for current illness. - Recommend drinking tea with honey and using cough drops for symptomatic relief.     Bacterial Conjunctivitis (Resolved) Recent episode treated with steroid eye drops, now resolved. Condition may have been related to current viral illness. - No further treatment required     No follow-ups on file.     I discussed the assessment and treatment plan with the patient. The patient was provided an opportunity to ask questions and all were answered. The patient agreed with the plan and demonstrated an understanding of the instructions.   The patient was advised to call back or seek an in-person evaluation if the symptoms worsen or if the condition fails to improve as anticipated.  I provided 10 minutes of non-face-to-face time during this  encounter.   Ronnald Ramp, MD [facility] [phone] (phone) [phone] (fax)  [facility]

## 2023-09-08 NOTE — Patient Instructions (Signed)
VISIT SUMMARY:  During today's visit, we discussed your recent symptoms of a dry, hacking cough, mild runny nose, and postnasal drip, which began with a sore throat and loss of voice. We also reviewed your history of prediabetes, obesity, and a recent bacterial eye infection that has resolved.  YOUR PLAN:  -ACUTE BRONCHITIS: Acute bronchitis is an inflammation of the bronchial tubes, usually caused by a viral infection, leading to a persistent cough and other symptoms. We have prescribed Tessalon Perles 200 mg to be taken twice a day as needed for your cough, an albuterol inhaler to be used every four hours as needed for wheezing, and cetirizine 10 mg once daily for postnasal drip. Please seek emergency care if you experience lightheadedness, dizziness, or difficulty breathing. Follow up if your symptoms persist or worsen in 10 days.  Please follow up if your symptoms persist or worsen in 10 days. Seek emergency care if you experience lightheadedness, dizziness, or difficulty breathing.

## 2023-09-29 ENCOUNTER — Ambulatory Visit (INDEPENDENT_AMBULATORY_CARE_PROVIDER_SITE_OTHER): Payer: BC Managed Care – PPO | Admitting: Family Medicine

## 2023-09-29 ENCOUNTER — Encounter: Payer: Self-pay | Admitting: Family Medicine

## 2023-09-29 VITALS — BP 127/64 | HR 80 | Resp 18 | Ht 65.0 in | Wt 224.0 lb

## 2023-09-29 DIAGNOSIS — E611 Iron deficiency: Secondary | ICD-10-CM

## 2023-09-29 DIAGNOSIS — M25562 Pain in left knee: Secondary | ICD-10-CM

## 2023-09-29 DIAGNOSIS — E782 Mixed hyperlipidemia: Secondary | ICD-10-CM | POA: Diagnosis not present

## 2023-09-29 DIAGNOSIS — R7303 Prediabetes: Secondary | ICD-10-CM

## 2023-09-29 DIAGNOSIS — Z0001 Encounter for general adult medical examination with abnormal findings: Secondary | ICD-10-CM

## 2023-09-29 DIAGNOSIS — E669 Obesity, unspecified: Secondary | ICD-10-CM

## 2023-09-29 DIAGNOSIS — Z0000 Encounter for general adult medical examination without abnormal findings: Secondary | ICD-10-CM

## 2023-09-29 DIAGNOSIS — Z79899 Other long term (current) drug therapy: Secondary | ICD-10-CM

## 2023-09-30 LAB — CBC
Hematocrit: 35.9 % (ref 34.0–46.6)
Hemoglobin: 11.7 g/dL (ref 11.1–15.9)
MCH: 28.7 pg (ref 26.6–33.0)
MCHC: 32.6 g/dL (ref 31.5–35.7)
MCV: 88 fL (ref 79–97)
Platelets: 239 10*3/uL (ref 150–450)
RBC: 4.08 x10E6/uL (ref 3.77–5.28)
RDW: 13.4 % (ref 11.7–15.4)
WBC: 6.7 10*3/uL (ref 3.4–10.8)

## 2023-09-30 LAB — IRON,TIBC AND FERRITIN PANEL
Ferritin: 188 ng/mL — ABNORMAL HIGH (ref 15–150)
Iron Saturation: 31 % (ref 15–55)
Iron: 82 ug/dL (ref 27–139)
Total Iron Binding Capacity: 261 ug/dL (ref 250–450)
UIBC: 179 ug/dL (ref 118–369)

## 2023-09-30 LAB — COMPREHENSIVE METABOLIC PANEL
ALT: 10 [IU]/L (ref 0–32)
AST: 13 [IU]/L (ref 0–40)
Albumin: 3.7 g/dL — ABNORMAL LOW (ref 3.9–4.9)
Alkaline Phosphatase: 59 [IU]/L (ref 44–121)
BUN/Creatinine Ratio: 13 (ref 12–28)
BUN: 11 mg/dL (ref 8–27)
Bilirubin Total: 0.4 mg/dL (ref 0.0–1.2)
CO2: 24 mmol/L (ref 20–29)
Calcium: 9 mg/dL (ref 8.7–10.3)
Chloride: 108 mmol/L — ABNORMAL HIGH (ref 96–106)
Creatinine, Ser: 0.86 mg/dL (ref 0.57–1.00)
Globulin, Total: 3.7 g/dL (ref 1.5–4.5)
Glucose: 90 mg/dL (ref 70–99)
Potassium: 4.2 mmol/L (ref 3.5–5.2)
Sodium: 137 mmol/L (ref 134–144)
Total Protein: 7.4 g/dL (ref 6.0–8.5)
eGFR: 76 mL/min/{1.73_m2} (ref >59)

## 2023-09-30 LAB — LIPID PANEL
Chol/HDL Ratio: 3.4 {ratio} (ref 0.0–4.4)
Cholesterol, Total: 168 mg/dL (ref 100–199)
HDL: 49 mg/dL (ref >39)
LDL Chol Calc (NIH): 104 mg/dL — ABNORMAL HIGH (ref 0–99)
Triglycerides: 81 mg/dL (ref 0–149)
VLDL Cholesterol Cal: 15 mg/dL (ref 5–40)

## 2023-09-30 LAB — HEMOGLOBIN A1C
Est. average glucose Bld gHb Est-mCnc: 134 mg/dL
Hgb A1c MFr Bld: 6.3 % — ABNORMAL HIGH (ref 4.8–5.6)

## 2023-09-30 LAB — VITAMIN B12: Vitamin B-12: 440 pg/mL (ref 232–1245)

## 2023-10-01 ENCOUNTER — Other Ambulatory Visit: Payer: Self-pay | Admitting: Family Medicine

## 2023-10-01 DIAGNOSIS — R7303 Prediabetes: Secondary | ICD-10-CM

## 2023-10-02 ENCOUNTER — Telehealth: Payer: Self-pay

## 2023-10-02 NOTE — Telephone Encounter (Signed)
 Copied from CRM [id number]. Topic: General - Other >> Sep 30, 2023  2:44 PM Selinda RAMAN wrote: Reason for CRM: The patient called in stating she would like a call from her provider or nurse to go over her recent lab results when someone gets a chance. Please assist patient further

## 2023-10-03 ENCOUNTER — Telehealth: Payer: Self-pay | Admitting: Family Medicine

## 2023-10-03 NOTE — Telephone Encounter (Signed)
 Called and discussed lab work with patient. Counseled on improving diet and increasing exercise as tolerated.  Advised her to hold her iron supplement for one week, then resume with every other day dosing, rather than daily dosing. Will recheck blood work at next visit.

## 2023-10-06 DIAGNOSIS — Z0000 Encounter for general adult medical examination without abnormal findings: Secondary | ICD-10-CM | POA: Insufficient documentation

## 2023-10-06 DIAGNOSIS — E611 Iron deficiency: Secondary | ICD-10-CM | POA: Insufficient documentation

## 2023-10-06 DIAGNOSIS — M25562 Pain in left knee: Secondary | ICD-10-CM | POA: Insufficient documentation

## 2023-10-06 NOTE — Assessment & Plan Note (Signed)
 Generally in good health. Active at work, exercises when possible. Received flu and shingles vaccines, declined COVID booster. Due for colonoscopy, history of diverticulosis and hemorrhoids, no recent bleeding. Recent mammogram and eye exam completed. Takes multiple vitamins and supplements daily. - Order metabolic panel including liver and kidney function, and electrolytes - Encourage scheduling a colonoscopy - Continue current vitamin and supplement regimen

## 2023-10-06 NOTE — Assessment & Plan Note (Signed)
Will recheck lipid panel today 

## 2023-10-06 NOTE — Assessment & Plan Note (Signed)
 Intermittent knee pain, likely due to prolonged standing and inadequate footwear. Improved with new shoes and glucosamine chondroitin. Pain absent when not working long hours. - Continue glucosamine chondroitin 500 mg daily - Encourage use of supportive shoes and compression socks - Provide exercises to strengthen supporting muscles

## 2023-10-06 NOTE — Assessment & Plan Note (Signed)
 Iron deficiency managed with time-released iron supplement. Reports increased energy. Iron supplementation initiated to improve blood donation eligibility. - Check iron levels with blood work

## 2023-10-06 NOTE — Assessment & Plan Note (Signed)
Counseled patient on diet and exercise.

## 2023-10-06 NOTE — Assessment & Plan Note (Signed)
 Managed with metformin  500 mg daily. Reports urgency with bowel movements, likely metformin -related. Discussed Ozempic and Wegovy, declined due to aversion to injections. Emphasis on diet and exercise for weight management. Prefers lifestyle changes over medication adjustments. - Continue metformin  500 mg daily - Encourage diet and exercise modifications - Consider referral to a nutritionist if interested

## 2024-01-26 ENCOUNTER — Encounter: Payer: Self-pay | Admitting: Physician Assistant

## 2024-01-26 ENCOUNTER — Ambulatory Visit: Admitting: Physician Assistant

## 2024-01-26 VITALS — BP 124/79 | HR 66 | Resp 16 | Ht 65.0 in | Wt 222.4 lb

## 2024-01-26 DIAGNOSIS — Z79899 Other long term (current) drug therapy: Secondary | ICD-10-CM | POA: Diagnosis not present

## 2024-01-26 DIAGNOSIS — R7303 Prediabetes: Secondary | ICD-10-CM | POA: Diagnosis not present

## 2024-01-26 DIAGNOSIS — R42 Dizziness and giddiness: Secondary | ICD-10-CM | POA: Diagnosis not present

## 2024-01-26 NOTE — Progress Notes (Signed)
 Established patient visit  Patient: Jennifer Sawyer   DOB: 1961/06/10   63 y.o. Female  MRN: [id number] Visit Date: 01/26/2024  Today's healthcare provider: Blane Bunting, PA-C   Chief Complaint  Patient presents with   Hypertension    BP /dizziness last night first time experiencing this.   Subjective     HPI     Hypertension    Additional comments: BP /dizziness last night first time experiencing this.      Last edited by Estill Hemming, CMA on 01/26/2024 10:47 AM.       Discussed the use of AI scribe software for clinical note transcription with the patient, who gave verbal consent to proceed.  History of Present Illness The patient, with a history of type 2 diabetes managed with metformin , presents with new onset dizziness. The dizziness began the previous night and persisted into the morning. The patient describes the dizziness as positional, occurring upon standing or moving, but not when lying down or moving her head. She denies any associated chest pain, shortness of breath, or palpitations. The patient also reports taking over-the-counter allergy medication recently due to allergy symptoms. She denies any changes in diet or medication regimen. The patient's blood pressure was measured at school and was reportedly high, but was normal at the time of the visit.       09/29/2023    9:40 AM 04/11/2023   10:09 AM 03/28/2023    2:38 PM  Depression screen PHQ 2/9  Decreased Interest 0 0 0  Down, Depressed, Hopeless 0 0 0  PHQ - 2 Score 0 0 0  Altered sleeping 0 0 0  Tired, decreased energy 0 0 0  Change in appetite 0 0 0  Feeling bad or failure about yourself  0 0 0  Trouble concentrating 0 0 0  Moving slowly or fidgety/restless 0 0 0  Suicidal thoughts 0 0 0  PHQ-9 Score 0 0 0  Difficult doing work/chores  Not difficult at all Not difficult at all      09/29/2023    9:40 AM  GAD 7 : Generalized Anxiety Score  Nervous, Anxious, on Edge 0  Control/stop  worrying 0  Worry too much - different things 0  Trouble relaxing 0  Restless 0  Easily annoyed or irritable 0  Afraid - awful might happen 0  Total GAD 7 Score 0    Medications: Outpatient Medications Prior to Visit  Medication Sig   metFORMIN  (GLUCOPHAGE -XR) 500 MG 24 hr tablet TAKE 1 TABLET BY MOUTH EVERY DAY WITH BREAKFAST   APPLE CIDER VINEGAR PO Take 1 capsule by mouth daily. (Patient not taking: Reported on 01/26/2024)   aspirin EC 81 MG tablet Take 81 mg by mouth daily. Swallow whole. (Patient not taking: Reported on 01/26/2024)   calcium-vitamin D (OSCAL WITH D) 500-5 MG-MCG tablet Take 1 tablet by mouth daily. (Patient not taking: Reported on 01/26/2024)   CINNAMON PO Take 1 tablet by mouth daily. (Patient not taking: Reported on 01/26/2024)   ferrous sulfate 325 (65 FE) MG EC tablet Take 325 mg by mouth daily. (Patient not taking: Reported on 01/26/2024)   Glucosamine-Chondroit-Vit C-Mn (GLUCOSAMINE CHONDR 500 COMPLEX PO) Take 1 capsule by mouth daily. (Patient not taking: Reported on 01/26/2024)   Multiple Vitamins-Minerals (ALIVE WOMENS 50+ GUMMY) CHEW Chew 1 tablet by mouth daily. (Patient not taking: Reported on 01/26/2024)   Omega-3 Fatty Acids (FISH OIL) 1000 MG CAPS Take 1 capsule by mouth daily. (Patient not taking:  Reported on 01/26/2024)   TURMERIC PO Take 1 tablet by mouth daily. (Patient not taking: Reported on 01/26/2024)   No facility-administered medications prior to visit.    Review of Systems All negative Except see HPI       Objective    BP 124/79 (BP Location: Left Arm, Patient Position: Sitting, Cuff Size: Large)   Pulse 66   Resp 16   Ht 5\' 5"  (1.651 m)   Wt 222 lb 6.4 oz (100.9 kg)   SpO2 99%   BMI 37.01 kg/m     Physical Exam Vitals reviewed.  Constitutional:      General: She is not in acute distress.    Appearance: Normal appearance. She is well-developed. She is not diaphoretic.  HENT:     Head: Normocephalic and atraumatic.  Eyes:      General: No scleral icterus.    Conjunctiva/sclera: Conjunctivae normal.  Neck:     Thyroid: No thyromegaly.  Cardiovascular:     Rate and Rhythm: Normal rate and regular rhythm.     Pulses: Normal pulses.     Heart sounds: Normal heart sounds. No murmur heard. Pulmonary:     Effort: Pulmonary effort is normal. No respiratory distress.     Breath sounds: Normal breath sounds. No wheezing, rhonchi or rales.  Musculoskeletal:     Cervical back: Neck supple.     Right lower leg: No edema.     Left lower leg: No edema.  Lymphadenopathy:     Cervical: No cervical adenopathy.  Skin:    General: Skin is warm and dry.     Findings: No rash.  Neurological:     Mental Status: She is alert and oriented to person, place, and time. Mental status is at baseline.  Psychiatric:        Mood and Affect: Mood normal.        Behavior: Behavior normal.      No results found for any visits on 01/26/24.      Assessment & Plan Dizziness Acute positional and orthostatic dizziness likely related to decongestant use or dehydration. Orthostatic hypotension and medication side effects considered. Orthostatic vitals showed no significant changes. Normal vitals. EKG showed NSR  In the past due to URI - Monitor blood pressure bi-daily for two weeks. - Schedule follow-up in two weeks to review blood pressure log and evaluate dizziness. - Consider home blood pressure cuff acquisition.  Prediabetes/Type 2 diabetes mellitus, per pt Chronic and managed with metformin . No home glucose monitoring or recent A1c check. - Order blood work for hemoglobin, electrolytes, kidney function, and blood glucose levels.  Will follow-up in 2 weeks with PCP for BP  No orders of the defined types were placed in this encounter.   No follow-ups on file.   The patient was advised to call back or seek an in-person evaluation if the symptoms worsen or if the condition fails to improve as anticipated.  I discussed the  assessment and treatment plan with the patient. The patient was provided an opportunity to ask questions and all were answered. The patient agreed with the plan and demonstrated an understanding of the instructions.  I, Jlen Wintle, PA-C have reviewed all documentation for this visit. The documentation on 01/26/2024  for the exam, diagnosis, procedures, and orders are all accurate and complete.  Blane Bunting, [facility], MMS [facility] [phone] (phone) [phone] (fax)  [facility]

## 2024-01-27 ENCOUNTER — Encounter: Payer: Self-pay | Admitting: Physician Assistant

## 2024-01-27 LAB — COMPREHENSIVE METABOLIC PANEL WITH GFR
ALT: 11 IU/L (ref 0–32)
AST: 16 IU/L (ref 0–40)
Albumin: 4.1 g/dL (ref 3.9–4.9)
Alkaline Phosphatase: 64 IU/L (ref 44–121)
BUN/Creatinine Ratio: 11 — ABNORMAL LOW (ref 12–28)
BUN: 9 mg/dL (ref 8–27)
Bilirubin Total: 0.4 mg/dL (ref 0.0–1.2)
CO2: 22 mmol/L (ref 20–29)
Calcium: 9.8 mg/dL (ref 8.7–10.3)
Chloride: 105 mmol/L (ref 96–106)
Creatinine, Ser: 0.81 mg/dL (ref 0.57–1.00)
Globulin, Total: 3.6 g/dL (ref 1.5–4.5)
Glucose: 104 mg/dL — ABNORMAL HIGH (ref 70–99)
Potassium: 4.4 mmol/L (ref 3.5–5.2)
Sodium: 141 mmol/L (ref 134–144)
Total Protein: 7.7 g/dL (ref 6.0–8.5)
eGFR: 82 mL/min/{1.73_m2} (ref >59)

## 2024-01-27 LAB — CBC WITH DIFFERENTIAL/PLATELET
Basophils Absolute: 0.1 10*3/uL (ref 0.0–0.2)
Basos: 1 %
EOS (ABSOLUTE): 0.2 10*3/uL (ref 0.0–0.4)
Eos: 2 %
Hematocrit: 38 % (ref 34.0–46.6)
Hemoglobin: 12.2 g/dL (ref 11.1–15.9)
Immature Grans (Abs): 0 10*3/uL (ref 0.0–0.1)
Immature Granulocytes: 0 %
Lymphocytes Absolute: 2.2 10*3/uL (ref 0.7–3.1)
Lymphs: 26 %
MCH: 28.4 pg (ref 26.6–33.0)
MCHC: 32.1 g/dL (ref 31.5–35.7)
MCV: 89 fL (ref 79–97)
Monocytes Absolute: 0.5 10*3/uL (ref 0.1–0.9)
Monocytes: 6 %
Neutrophils Absolute: 5.5 10*3/uL (ref 1.4–7.0)
Neutrophils: 65 %
Platelets: 282 10*3/uL (ref 150–450)
RBC: 4.29 x10E6/uL (ref 3.77–5.28)
RDW: 13.9 % (ref 11.7–15.4)
WBC: 8.4 10*3/uL (ref 3.4–10.8)

## 2024-02-04 ENCOUNTER — Encounter (HOSPITAL_COMMUNITY): Payer: Self-pay

## 2024-02-11 ENCOUNTER — Ambulatory Visit: Admitting: Family Medicine

## 2024-09-29 ENCOUNTER — Encounter: Payer: Self-pay | Admitting: Family Medicine

## 2024-09-29 ENCOUNTER — Encounter: Admitting: Family Medicine

## 2024-09-29 VITALS — BP 117/69 | HR 78 | Temp 98.4°F | Ht 65.0 in | Wt 232.8 lb

## 2024-09-29 DIAGNOSIS — Z0001 Encounter for general adult medical examination with abnormal findings: Secondary | ICD-10-CM

## 2024-09-29 DIAGNOSIS — Z79899 Other long term (current) drug therapy: Secondary | ICD-10-CM | POA: Diagnosis not present

## 2024-09-29 DIAGNOSIS — Z0000 Encounter for general adult medical examination without abnormal findings: Secondary | ICD-10-CM

## 2024-09-29 DIAGNOSIS — Z7984 Long term (current) use of oral hypoglycemic drugs: Secondary | ICD-10-CM | POA: Diagnosis not present

## 2024-09-29 DIAGNOSIS — N182 Chronic kidney disease, stage 2 (mild): Secondary | ICD-10-CM | POA: Diagnosis not present

## 2024-09-29 DIAGNOSIS — E782 Mixed hyperlipidemia: Secondary | ICD-10-CM

## 2024-09-29 DIAGNOSIS — E1122 Type 2 diabetes mellitus with diabetic chronic kidney disease: Secondary | ICD-10-CM

## 2024-09-29 MED ORDER — RYBELSUS 3 MG PO TABS: 3.0000 mg | ORAL_TABLET | Freq: Every day | ORAL | 3 refills | Status: AC

## 2024-09-29 NOTE — Progress Notes (Signed)
 "    Complete physical exam   Patient: Jennifer Sawyer   DOB: Aug 26, 1961   63 y.o. Female  MRN: [id number] Visit Date: 09/29/2024  Today's healthcare provider: LAURAINE LOISE BUOY, DO   Chief Complaint  Patient presents with   Annual Exam    Last CPE- 09/29/2023 Diet- General Exercise- Walking at her job, on feet out of 12 hours out of the day and constantly walking up and down steps Overall feeling- Fine, not any problems Sleep- Good, tired by the time she gets off work Concerns- None  Mammogram- [facility]  Colonoscopy- needs to schedule  Cervical Cancer Screening- [facility]  Pneumococcal Vaccine- Declined   Subjective    Jennifer Sawyer is a 63 y.o. female who presents today for a complete physical exam.   HPI HPI     Annual Exam    Additional comments: Last CPE- 09/29/2023 Diet- General Exercise- Walking at her job, on feet out of 12 hours out of the day and constantly walking up and down steps Overall feeling- Fine, not any problems Sleep- Good, tired by the time she gets off work Concerns- None  Mammogram- [facility]  Colonoscopy- needs to schedule  Cervical Cancer Screening- [facility]  Pneumococcal Vaccine- Declined      Last edited by Terrel Powell CROME, CMA on 09/29/2024  8:25 AM.       Jennifer Sawyer is a 63 year old female who presents for an annual physical exam.  She has no current concerns and remains active, frequently on her feet. She completed her cervical screening and Pap smear at her gynecology clinic but has not had a mammogram this year. She received a flu shot in October but declined the pneumonia vaccine and is not planning to get the COVID booster.  She manages her prediabetes with metformin , taking one tablet daily. Her last A1c was 6.3. She is interested in weight loss to potentially discontinue medication and has been researching GLP-1 medications but is concerned about the cost and administration  method (she is averse to injections). She is also considering other weight loss options but has not made any decisions.  She inquired about the impact of her vitamins on kidney function. In terms of her diet, she avoids fried foods, starchy foods, and soft drinks, and is trying to eat more salads, cereal, yogurt, and oatmeal. She has not consumed pork in three years. She is motivated to improve her health and reduce medication reliance.  She is scheduled for an eye exam on October 07, 2024.    Past Medical History:  Diagnosis Date   Allergy 2010   Past Surgical History:  Procedure Laterality Date   ABDOMINAL HYSTERECTOMY     partial   CYSTECTOMY     ovarian   Social History   Socioeconomic History   Marital status: Single    Spouse name: Not on file   Number of children: 0   Years of education: associates   Highest education level: Associate degree: occupational, scientist, product/process development, or vocational program  Occupational History    Employer: [employer]  Tobacco Use   Smoking status: Never   Smokeless tobacco: Never  Substance and Sexual Activity   Alcohol use: No   Drug use: No   Sexual activity: Not Currently  Other Topics Concern   Not on file  Social History Narrative   Pt has niece who currently lives with pt.   Social Drivers of Health   Tobacco Use:  Low Risk (09/29/2024)   Patient History    Smoking Tobacco Use: Never    Smokeless Tobacco Use: Never    Passive Exposure: Not on file  Financial Resource Strain: Low Risk (09/29/2024)   Overall Financial Resource Strain (CARDIA)    Difficulty of Paying Living Expenses: Not hard at all  Food Insecurity: No Food Insecurity (09/29/2024)   Epic    Worried About Programme Researcher, Broadcasting/film/video in the Last Year: Never true    Ran Out of Food in the Last Year: Never true  Transportation Needs: No Transportation Needs (09/29/2024)   Epic    Lack of Transportation (Medical): No    Lack of Transportation (Non-Medical): No  Physical  Activity: Sufficiently Active (09/29/2024)   Exercise Vital Sign    Days of Exercise per Week: 5 days    Minutes of Exercise per Session: 30 min  Stress: No Stress Concern Present (09/29/2024)   [employer] of Occupational Health - Occupational Stress Questionnaire    Feeling of Stress: Only a little  Social Connections: Moderately Integrated (09/29/2024)   Social Connection and Isolation Panel    Frequency of Communication with Friends and Family: More than three times a week    Frequency of Social Gatherings with Friends and Family: Twice a week    Attends Religious Services: More than 4 times per year    Active Member of Clubs or Organizations: Yes    Attends Banker Meetings: More than 4 times per year    Marital Status: Never married  Intimate Partner Violence: Not At Risk (09/29/2024)   Epic    Fear of Current or Ex-Partner: No    Emotionally Abused: No    Physically Abused: No    Sexually Abused: No  Depression (PHQ2-9): Low Risk (01/26/2024)   Depression (PHQ2-9)    PHQ-2 Score: 0  Recent Concern: Depression (PHQ2-9) - Medium Risk (01/26/2024)   Depression (PHQ2-9)    PHQ-2 Score: 5  Alcohol Screen: Low Risk (09/29/2024)   Alcohol Screen    Last Alcohol Screening Score (AUDIT): 0  Housing: Low Risk (09/29/2024)   Epic    Unable to Pay for Housing in the Last Year: No    Number of Times Moved in the Last Year: 0    Homeless in the Last Year: No  Utilities: Not At Risk (09/29/2024)   Epic    Threatened with loss of utilities: No  Health Literacy: Adequate Health Literacy (09/29/2024)   B1300 Health Literacy    Frequency of need for help with medical instructions: Never   Family Status  Relation Name Status   Mother Samyra Limb Alive   Father Amily Depp Alive   Brother  Alive   Brother  Alive   MGM Gabriella Seeds (Not Specified)  No partnership data on file   Family History  Problem Relation Age of Onset   Healthy Mother    Breast cancer Mother     Cancer Mother    Healthy Father    COPD Father    Healthy Brother    Healthy Brother    Cancer Maternal Grandmother    Allergies[1]  Patient Care Team: Larosa Rhines, Lauraine SAILOR, DO as PCP - General (Family Medicine)   Medications: Show/hide medication list[2]  Review of Systems  Constitutional:  Negative for chills, fatigue and fever.  HENT:  Negative for congestion, ear pain, rhinorrhea, sneezing and sore throat.   Eyes: Negative.  Negative for pain and redness.  Respiratory:  Negative for  cough, shortness of breath and wheezing.   Cardiovascular:  Negative for chest pain and leg swelling.  Gastrointestinal:  Negative for abdominal pain, blood in stool, constipation, diarrhea and nausea.  Endocrine: Negative for polydipsia and polyphagia.  Genitourinary: Negative.  Negative for dysuria, flank pain, hematuria, pelvic pain, vaginal bleeding and vaginal discharge.  Musculoskeletal:  Negative for arthralgias, back pain, gait problem and joint swelling.  Skin:  Negative for rash.  Neurological: Negative.  Negative for dizziness, tremors, seizures, weakness, light-headedness, numbness and headaches.  Hematological:  Negative for adenopathy.  Psychiatric/Behavioral: Negative.  Negative for behavioral problems, confusion and dysphoric mood. The patient is not nervous/anxious and is not hyperactive.       Objective    BP 117/69 (BP Location: Right Arm, Patient Position: Sitting, Cuff Size: Large)   Pulse 78   Temp 98.4 F (36.9 C) (Oral)   Ht 5' 5 (1.651 m)   Wt 232 lb 12.8 oz (105.6 kg)   SpO2 99%   BMI 38.74 kg/m    Physical Exam Vitals and nursing note reviewed.  Constitutional:      General: She is awake.     Appearance: Normal appearance.  HENT:     Head: Normocephalic and atraumatic.     Right Ear: Tympanic membrane, ear canal and external ear normal.     Left Ear: Tympanic membrane, ear canal and external ear normal.     Nose: Nose normal.     Mouth/Throat:      Mouth: Mucous membranes are moist.     Pharynx: Oropharynx is clear. No oropharyngeal exudate or posterior oropharyngeal erythema.  Eyes:     General: No scleral icterus.    Extraocular Movements: Extraocular movements intact.     Conjunctiva/sclera: Conjunctivae normal.     Pupils: Pupils are equal, round, and reactive to light.  Neck:     Thyroid: No thyromegaly or thyroid tenderness.  Cardiovascular:     Rate and Rhythm: Normal rate and regular rhythm.     Pulses: Normal pulses.     Heart sounds: Normal heart sounds.  Pulmonary:     Effort: Pulmonary effort is normal. No tachypnea, bradypnea or respiratory distress.     Breath sounds: Normal breath sounds. No stridor. No wheezing, rhonchi or rales.  Abdominal:     General: Bowel sounds are normal. There is no distension.     Palpations: Abdomen is soft. There is no mass.     Tenderness: There is no abdominal tenderness. There is no guarding.     Hernia: No hernia is present.  Musculoskeletal:     Cervical back: Normal range of motion and neck supple.     Right lower leg: No edema.     Left lower leg: No edema.  Lymphadenopathy:     Cervical: No cervical adenopathy.  Skin:    General: Skin is warm and dry.  Neurological:     Mental Status: She is alert and oriented to person, place, and time. Mental status is at baseline.  Psychiatric:        Mood and Affect: Mood normal.        Behavior: Behavior normal.      Last depression screening scores    01/26/2024    3:21 PM 01/26/2024   11:35 AM 09/29/2023    9:40 AM  PHQ 2/9 Scores  PHQ - 2 Score 0 0 0  PHQ- 9 Score 0  5  0      Data saved with a  previous flowsheet row definition   Last fall risk screening    09/29/2023    9:40 AM  Fall Risk   Falls in the past year? 0  Number falls in past yr: 0  Injury with Fall? 0      Data saved with a previous flowsheet row definition   Last Audit-C alcohol use screening    09/29/2024    8:28 AM  Alcohol Use Disorder  Test (AUDIT)  1. How often do you have a drink containing alcohol? 0  2. How many drinks containing alcohol do you have on a typical day when you are drinking? 0  3. How often do you have six or more drinks on one occasion? 0  AUDIT-C Score 0   A score of 3 or more in women, and 4 or more in men indicates increased risk for alcohol abuse, EXCEPT if all of the points are from question 1   No results found for any visits on 09/29/24.  Assessment & Plan    Routine Health Maintenance and Physical Exam  Exercise Activities and Dietary recommendations  Goals   None     Immunization History  Administered Date(s) Administered   Hep A / Hep B 07/04/2020, 08/03/2020, 01/18/2021   Influenza-Unspecified 07/01/2015, 07/09/2016, 07/04/2020, 06/30/2022   Moderna Sars-Covid-2 Vaccination 12/23/2019, 01/26/2020, 09/26/2020, 09/27/2021   PPD Test 07/04/2020   Td 09/20/2022   Tdap 08/01/2011   Unspecified SARS-COV-2 Vaccination 07/13/2022   Zoster Recombinant(Shingrix) 08/03/2022, 11/04/2022    Health Maintenance  Topic Date Due   Diabetic kidney evaluation - Urine ACR  Never done   Colonoscopy  10/01/2024 (Originally 05/08/2023)   Cervical Cancer Screening (HPV/Pap Cotest)  10/27/2024 (Originally 01/19/2024)   Mammogram  02/28/2025 (Originally 04/24/2024)   COVID-19 Vaccine (6 - 2025-26 season) 05/30/2025 (Originally 05/31/2024)   Pneumococcal Vaccine: 50+ Years (1 of 1 - PCV) 09/29/2025 (Originally 08/31/2011)   Diabetic kidney evaluation - eGFR measurement  01/25/2025   DTaP/Tdap/Td (3 - Td or Tdap) 09/20/2032   Hepatitis B Vaccines 19-59 Average Risk  Completed   Hepatitis C Screening  Completed   HIV Screening  Completed   Zoster Vaccines- Shingrix  Completed   HPV VACCINES  Aged Out   Meningococcal B Vaccine  Aged Out   Influenza Vaccine  Discontinued    Discussed health benefits of physical activity, and encouraged her to engage in regular exercise appropriate for her age and  condition.   Annual physical exam  Type 2 diabetes mellitus with stage 2 chronic kidney disease, without long-term current use of insulin (HCC) -     Comprehensive metabolic panel with GFR -     Hemoglobin A1c -     Vitamin B12 -     Rybelsus; Take 1 tablet (3 mg total) by mouth daily.  Dispense: 30 tablet; Refill: 3 -     Microalbumin / creatinine urine ratio  Moderate mixed hyperlipidemia not requiring statin therapy -     Lipid panel  High risk medication use -     Vitamin B12     Annual physical exam Physical exam overall unremarkable except as noted above. Routine lab work ordered as noted.  Routine health maintenance discussed. Cervical screening completed; requesting record. Mammogram not done this year. Colonoscopy planned. Flu shot received. Declined pneumonia vaccine and COVID booster. Eye exam scheduled. - Patient to call and schedule colonoscopy. - Ensure diabetic eye exam during eye exam on January 8th.  Type 2 diabetes mellitus with stage 2  chronic kidney disease, without long-term current use of insulin Type 2 diabetes managed with metformin . A1c at 6.3%, while on 1/4 of maximum metformin  dose. Maximum metformin  dose can lower A1c 1-2%, so her un-medicated A1c is estimated at 6.5-6.8. Chronic kidney disease stage 2 stable. Discussed GLP-1 agonists for weight loss and diabetes management. Prefers oral medication. Discussed that insurance coverage is uncertain but will prescribe Rybelsus as noted. - Continue metformin  500 mg daily. - Prescribe Rybelsus - Ensure diabetic eye exam during eye exam on January 8th.    Return in about 6 months (around 03/29/2025) for DM, Chronic f/u w/next provider.     I discussed the assessment and treatment plan with the patient  The patient was provided an opportunity to ask questions and all were answered. The patient agreed with the plan and demonstrated an understanding of the instructions.   The patient was advised to call back or  seek an in-person evaluation if the symptoms worsen or if the condition fails to improve as anticipated.    LAURAINE LOISE BUOY, DO  [facility] [phone] (phone) [phone] (fax)   Medical Group    [1]  Allergies Allergen Reactions   Codeine    Hydrocodone Swelling   Hydrocortisone Swelling  [2]  Outpatient Medications Prior to Visit  Medication Sig   APPLE CIDER VINEGAR PO Take 1 capsule by mouth daily.   aspirin EC 81 MG tablet Take 81 mg by mouth daily. Swallow whole.   calcium-vitamin D (OSCAL WITH D) 500-5 MG-MCG tablet Take 1 tablet by mouth daily.   CINNAMON PO Take 1 tablet by mouth daily.   ferrous sulfate 325 (65 FE) MG EC tablet Take 325 mg by mouth daily.   Glucosamine-Chondroit-Vit C-Mn (GLUCOSAMINE CHONDR 500 COMPLEX PO) Take 1 capsule by mouth daily.   metFORMIN  (GLUCOPHAGE -XR) 500 MG 24 hr tablet TAKE 1 TABLET BY MOUTH EVERY DAY WITH BREAKFAST   Multiple Vitamins-Minerals (ALIVE WOMENS 50+ GUMMY) CHEW Chew 1 tablet by mouth daily.   Omega-3 Fatty Acids (FISH OIL) 1000 MG CAPS Take 1 capsule by mouth daily.   TURMERIC PO Take 1 tablet by mouth daily.   No facility-administered medications prior to visit.   "

## 2024-09-30 LAB — MICROALBUMIN / CREATININE URINE RATIO
Creatinine, Urine: 124 mg/dL
Microalb/Creat Ratio: 4 mg/g{creat} (ref 0–29)
Microalbumin, Urine: 5 ug/mL

## 2024-09-30 LAB — HEMOGLOBIN A1C
Est. average glucose Bld gHb Est-mCnc: 123 mg/dL
Hgb A1c MFr Bld: 5.9 % — ABNORMAL HIGH (ref 4.8–5.6)

## 2024-09-30 LAB — COMPREHENSIVE METABOLIC PANEL WITH GFR
ALT: 13 IU/L (ref 0–32)
AST: 17 IU/L (ref 0–40)
Albumin: 4.1 g/dL (ref 3.9–4.9)
Alkaline Phosphatase: 61 IU/L (ref 49–135)
BUN/Creatinine Ratio: 14 (ref 12–28)
BUN: 11 mg/dL (ref 8–27)
Bilirubin Total: 0.6 mg/dL (ref 0.0–1.2)
CO2: 25 mmol/L (ref 20–29)
Calcium: 9.1 mg/dL (ref 8.7–10.3)
Chloride: 103 mmol/L (ref 96–106)
Creatinine, Ser: 0.79 mg/dL (ref 0.57–1.00)
Globulin, Total: 3.5 g/dL (ref 1.5–4.5)
Glucose: 89 mg/dL (ref 70–99)
Potassium: 4.4 mmol/L (ref 3.5–5.2)
Sodium: 142 mmol/L (ref 134–144)
Total Protein: 7.6 g/dL (ref 6.0–8.5)
eGFR: 84 mL/min/1.73

## 2024-09-30 LAB — VITAMIN B12: Vitamin B-12: 867 pg/mL (ref 232–1245)

## 2024-09-30 LAB — LIPID PANEL
Chol/HDL Ratio: 3.6 ratio (ref 0.0–4.4)
Cholesterol, Total: 169 mg/dL (ref 100–199)
HDL: 47 mg/dL
LDL Chol Calc (NIH): 109 mg/dL — ABNORMAL HIGH (ref 0–99)
Triglycerides: 66 mg/dL (ref 0–149)
VLDL Cholesterol Cal: 13 mg/dL (ref 5–40)

## 2024-10-09 ENCOUNTER — Ambulatory Visit: Payer: Self-pay | Admitting: Family Medicine

## 2024-11-04 ENCOUNTER — Other Ambulatory Visit: Payer: Self-pay | Admitting: Family Medicine

## 2024-11-04 DIAGNOSIS — R7303 Prediabetes: Secondary | ICD-10-CM

## 2025-03-30 ENCOUNTER — Encounter: Admitting: Family Medicine
